# Patient Record
Sex: Male | Born: 1955 | Race: White | Hispanic: No | Marital: Married | Smoking: Never smoker
Health system: Southern US, Community
[De-identification: ages and names within clinical notes are randomized; demographics above are authoritative.]

## PROBLEM LIST (undated history)

## (undated) DIAGNOSIS — I10 Essential (primary) hypertension: Secondary | ICD-10-CM

---

## 2016-02-27 ENCOUNTER — Emergency Department (HOSPITAL_BASED_OUTPATIENT_CLINIC_OR_DEPARTMENT_OTHER): Payer: 59

## 2016-02-27 ENCOUNTER — Inpatient Hospital Stay (HOSPITAL_BASED_OUTPATIENT_CLINIC_OR_DEPARTMENT_OTHER)
Admission: EM | Admit: 2016-02-27 | Discharge: 2016-02-29 | DRG: 064 | Disposition: A | Discharge status: Home / Self Care | Payer: 59 | Attending: Internal Medicine | Admitting: Internal Medicine

## 2016-02-27 ENCOUNTER — Inpatient Hospital Stay (HOSPITAL_COMMUNITY): Payer: 59

## 2016-02-27 ENCOUNTER — Encounter (HOSPITAL_BASED_OUTPATIENT_CLINIC_OR_DEPARTMENT_OTHER): Payer: Self-pay | Admitting: *Deleted

## 2016-02-27 DIAGNOSIS — E860 Dehydration: Secondary | ICD-10-CM | POA: Diagnosis present

## 2016-02-27 DIAGNOSIS — I10 Essential (primary) hypertension: Secondary | ICD-10-CM | POA: Diagnosis present

## 2016-02-27 DIAGNOSIS — G936 Cerebral edema: Secondary | ICD-10-CM | POA: Diagnosis present

## 2016-02-27 DIAGNOSIS — I471 Supraventricular tachycardia, unspecified: Secondary | ICD-10-CM | POA: Diagnosis present

## 2016-02-27 DIAGNOSIS — R112 Nausea with vomiting, unspecified: Secondary | ICD-10-CM | POA: Diagnosis present

## 2016-02-27 DIAGNOSIS — I618 Other nontraumatic intracerebral hemorrhage: Secondary | ICD-10-CM | POA: Diagnosis present

## 2016-02-27 DIAGNOSIS — Z823 Family history of stroke: Secondary | ICD-10-CM

## 2016-02-27 DIAGNOSIS — Z79899 Other long term (current) drug therapy: Secondary | ICD-10-CM

## 2016-02-27 DIAGNOSIS — Q211 Atrial septal defect: Secondary | ICD-10-CM

## 2016-02-27 DIAGNOSIS — I63441 Cerebral infarction due to embolism of right cerebellar artery: Principal | ICD-10-CM | POA: Diagnosis present

## 2016-02-27 DIAGNOSIS — E785 Hyperlipidemia, unspecified: Secondary | ICD-10-CM | POA: Diagnosis present

## 2016-02-27 DIAGNOSIS — R739 Hyperglycemia, unspecified: Secondary | ICD-10-CM | POA: Diagnosis present

## 2016-02-27 DIAGNOSIS — I6789 Other cerebrovascular disease: Secondary | ICD-10-CM | POA: Diagnosis not present

## 2016-02-27 DIAGNOSIS — R42 Dizziness and giddiness: Secondary | ICD-10-CM | POA: Diagnosis not present

## 2016-02-27 DIAGNOSIS — I639 Cerebral infarction, unspecified: Secondary | ICD-10-CM | POA: Diagnosis not present

## 2016-02-27 DIAGNOSIS — R279 Unspecified lack of coordination: Secondary | ICD-10-CM

## 2016-02-27 HISTORY — DX: Essential (primary) hypertension: I10

## 2016-02-27 LAB — CBC WITH DIFFERENTIAL/PLATELET
Basophils Absolute: 0 10*3/uL (ref 0.0–0.1)
Basophils Relative: 0 %
Eosinophils Absolute: 0 10*3/uL (ref 0.0–0.7)
Eosinophils Relative: 0 %
HCT: 40.2 % (ref 39.0–52.0)
HEMOGLOBIN: 13.9 g/dL (ref 13.0–17.0)
LYMPHS PCT: 8 %
Lymphs Abs: 0.6 10*3/uL — ABNORMAL LOW (ref 0.7–4.0)
MCH: 33.5 pg (ref 26.0–34.0)
MCHC: 34.6 g/dL (ref 30.0–36.0)
MCV: 96.9 fL (ref 78.0–100.0)
MONO ABS: 0.4 10*3/uL (ref 0.1–1.0)
Monocytes Relative: 6 %
NEUTROS ABS: 6.2 10*3/uL (ref 1.7–7.7)
Neutrophils Relative %: 86 %
Platelets: 183 10*3/uL (ref 150–400)
RBC: 4.15 MIL/uL — ABNORMAL LOW (ref 4.22–5.81)
RDW: 12.8 % (ref 11.5–15.5)
WBC: 7.2 10*3/uL (ref 4.0–10.5)

## 2016-02-27 LAB — BASIC METABOLIC PANEL
Anion gap: 9 (ref 5–15)
BUN: 22 mg/dL — ABNORMAL HIGH (ref 6–20)
CHLORIDE: 106 mmol/L (ref 101–111)
CO2: 25 mmol/L (ref 22–32)
Calcium: 9.3 mg/dL (ref 8.9–10.3)
Creatinine, Ser: 0.71 mg/dL (ref 0.61–1.24)
GFR calc non Af Amer: >60 mL/min (ref >60)
Glucose, Bld: 146 mg/dL — ABNORMAL HIGH (ref 65–99)
Potassium: 3.6 mmol/L (ref 3.5–5.1)
SODIUM: 140 mmol/L (ref 135–145)

## 2016-02-27 MED ORDER — DIPHENHYDRAMINE HCL 50 MG/ML IJ SOLN
12.5000 mg | Freq: Once | INTRAMUSCULAR | Status: AC
  Administered 2016-02-27: 12.5 mg via INTRAVENOUS
  Filled 2016-02-27: qty 1

## 2016-02-27 MED ORDER — ASPIRIN 300 MG RE SUPP: 300.0000 mg | Freq: Every day | RECTAL | Status: DC

## 2016-02-27 MED ORDER — DEXAMETHASONE SODIUM PHOSPHATE 10 MG/ML IJ SOLN
10.0000 mg | Freq: Once | INTRAMUSCULAR | Status: AC
  Administered 2016-02-27: 10 mg via INTRAVENOUS
  Filled 2016-02-27: qty 1

## 2016-02-27 MED ORDER — PROMETHAZINE HCL 25 MG/ML IJ SOLN
6.2500 mg | Freq: Once | INTRAMUSCULAR | Status: AC
  Administered 2016-02-27: 6.25 mg via INTRAVENOUS

## 2016-02-27 MED ORDER — ONDANSETRON HCL 4 MG/2ML IJ SOLN
4.0000 mg | Freq: Once | INTRAMUSCULAR | Status: AC
  Administered 2016-02-27: 4 mg via INTRAVENOUS

## 2016-02-27 MED ORDER — ASPIRIN 81 MG PO CHEW
324.0000 mg | CHEWABLE_TABLET | Freq: Once | ORAL | Status: AC
Start: 2016-02-27 — End: 2016-02-27
  Administered 2016-02-27: 324 mg via ORAL
  Filled 2016-02-27: qty 4

## 2016-02-27 MED ORDER — ONDANSETRON HCL 4 MG/2ML IJ SOLN
4.0000 mg | Freq: Four times a day (QID) | INTRAMUSCULAR | Status: DC
  Administered 2016-02-27 – 2016-02-28 (×5): 4 mg via INTRAVENOUS
  Filled 2016-02-27 (×5): qty 2

## 2016-02-27 MED ORDER — PROMETHAZINE HCL 25 MG/ML IJ SOLN
INTRAMUSCULAR | Status: AC
  Administered 2016-02-27: 6.25 mg via INTRAVENOUS
  Filled 2016-02-27: qty 1

## 2016-02-27 MED ORDER — STROKE: EARLY STAGES OF RECOVERY BOOK
Freq: Once | Status: AC
  Administered 2016-02-27: 16:00:00

## 2016-02-27 MED ORDER — MECLIZINE HCL 25 MG PO TABS
25.0000 mg | ORAL_TABLET | Freq: Once | ORAL | Status: AC
Start: 2016-02-27 — End: 2016-02-27
  Administered 2016-02-27: 25 mg via ORAL
  Filled 2016-02-27: qty 1

## 2016-02-27 MED ORDER — ASPIRIN 325 MG PO TABS
325.0000 mg | ORAL_TABLET | Freq: Every day | ORAL | Status: DC
  Administered 2016-02-28 – 2016-02-29 (×2): 325 mg via ORAL
  Filled 2016-02-27 (×2): qty 1

## 2016-02-27 MED ORDER — BLISTEX MEDICATED EX OINT
TOPICAL_OINTMENT | CUTANEOUS | Status: DC | PRN
  Administered 2016-02-27: 1 via TOPICAL

## 2016-02-27 MED ORDER — PROMETHAZINE HCL 25 MG/ML IJ SOLN
25.0000 mg | Freq: Four times a day (QID) | INTRAMUSCULAR | Status: DC | PRN
  Administered 2016-02-27: 25 mg via INTRAVENOUS
  Filled 2016-02-27: qty 1

## 2016-02-27 MED ORDER — KETOROLAC TROMETHAMINE 15 MG/ML IJ SOLN
15.0000 mg | Freq: Four times a day (QID) | INTRAMUSCULAR | Status: DC | PRN
  Administered 2016-02-27 – 2016-02-28 (×2): 15 mg via INTRAVENOUS
  Filled 2016-02-27 (×2): qty 1

## 2016-02-27 MED ORDER — ONDANSETRON HCL 4 MG/2ML IJ SOLN
INTRAMUSCULAR | Status: AC
  Administered 2016-02-27: 4 mg via INTRAVENOUS
  Filled 2016-02-27: qty 2

## 2016-02-27 MED ORDER — SODIUM CHLORIDE 0.9 % IV SOLN
INTRAVENOUS | Status: DC
  Administered 2016-02-27 – 2016-02-28 (×2): via INTRAVENOUS

## 2016-02-27 MED ORDER — DIAZEPAM 5 MG/ML IJ SOLN
5.0000 mg | Freq: Once | INTRAMUSCULAR | Status: AC
  Administered 2016-02-27: 5 mg via INTRAVENOUS
  Filled 2016-02-27: qty 2

## 2016-02-27 MED ORDER — ENOXAPARIN SODIUM 40 MG/0.4ML ~~LOC~~ SOLN
40.0000 mg | SUBCUTANEOUS | Status: DC
  Administered 2016-02-27 – 2016-02-28 (×2): 40 mg via SUBCUTANEOUS
  Filled 2016-02-27 (×3): qty 0.4

## 2016-02-27 MED ORDER — METOCLOPRAMIDE HCL 5 MG/ML IJ SOLN
5.0000 mg | Freq: Four times a day (QID) | INTRAMUSCULAR | Status: DC
  Administered 2016-02-27 – 2016-02-29 (×6): 5 mg via INTRAVENOUS
  Filled 2016-02-27 (×6): qty 2

## 2016-02-27 MED ORDER — ASPIRIN 325 MG PO TABS: 325.0000 mg | ORAL_TABLET | Freq: Every day | ORAL | Status: DC

## 2016-02-27 MED ORDER — BLISTEX MEDICATED EX OINT
TOPICAL_OINTMENT | CUTANEOUS | Status: AC
  Administered 2016-02-27: 1 via TOPICAL
  Filled 2016-02-27: qty 10

## 2016-02-27 NOTE — H&P (Signed)
History and Physical    Jacob NephewRex Lynch ZOX:[id number] DOB: 18-Oct-1955 DOA: 02/27/2016   PCP: No PCP Per Patient /located in [place]  Patient coming from/Resides with: Private residence/lives with spouse  Admission status: Inpatient/telemetry -medically necessary to stay a minimum 2 midnights to rule out impending and/or unexpected changes in physiologic status that may differ from initial evaluation performed in the ER and/or at time of admission. 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 Patient will require IV fluids and IV medications to manage his dizziness and nausea symptoms until he is able to tolerate oral medications/diet. He will also undergo an extensive stroke evaluation including PT/OT evaluation with likelihood he may discharge to a rehabilitation facility for further stroke care  Chief Complaint: Dizziness with nausea and vomiting  HPI: Jacob Lynch is a 60 y.o. male with medical history significant for hypertension and SVT. Patient works as a Research scientist (medical)consultant and had traveled from [place] to [place] to conduct business. He did fly via [employer] but was not on the plane longer than 2 hours. He has not taken any international flights recently. He reports that around 10:00 last night he became extremely dizzy with significant nausea and repeated episodes of vomiting. He did not recall any vertigo symptoms. He did not recall any specific visual field changes, blurred vision or diplopia. He was driven by a worker from [place] to [place] and brought to the ER here for further evaluation and treatment. Patient does report that his father has a history of CVA.  ED Course:  Vital Signs: BP (!) 148/67 (BP Location: Right Arm)   Pulse 69   Temp 99 F (37.2 C) (Oral)   Resp 17   Ht 6\' 7"  (2.007 m)   Wt  99.8 kg (220 lb)   SpO2 100%   BMI 24.78 kg/m  CT head without contrast: Subacute infarct in the right inferior cerebellar hemisphere in the PICA Lab data: Sodium 140, potassium 3.6, CO2 25, BUN 22, creatinine 0.71, glucose 146, white count 7200 with left shift but normal segmented neutrophils, hemoglobin 13.9, platelets 183,000 Medications and treatments: Valium 5 mg IV 1, Benadryl 12.5 mg IV 1, Antivert 25 mg PO 1, Zofran 4 mg IV 2 doses, Phenergan 6.25 mg IV 1, aspirin 324 mg ordered but not given secondary to severe nausea  Review of Systems:  In addition to the HPI above,  No Fever-chills, myalgias or other constitutional symptoms No Headache, changes with Vision or hearing, new weakness, tingling, numbness in any extremity, dysarthria or word finding difficulty, gait disturbance or imbalance, tremors or seizure activity No problems swallowing food or Liquids, indigestion/reflux, choking or coughing while eating, abdominal pain with or after eating No Chest pain, Cough or Shortness of Breath, palpitations, orthopnea or DOE No Abdominal pain, melena,hematochezia, dark tarry stools No dysuria, malodorous urine, hematuria or flank pain No new skin rashes, lesions, masses or bruises, No new joint pains, aches, swelling or redness No recent unintentional weight gain or loss No polyuria, polydypsia or polyphagia   Past Medical History:  Diagnosis Date  . Hypertension     History reviewed. No pertinent surgical history.  Social History   Social History  . Marital status: Married    Spouse name: N/A  . Number of children: N/A  . Years of education: N/A   Occupational History  . Not on file.  Social History Main Topics  . Smoking status: Never Smoker  . Smokeless tobacco: Never Used  . Alcohol use Yes     Comment: social  . Drug use: No  . Sexual activity: Not on file   Other Topics Concern  . Not on file   Social History Narrative  . No narrative on file     Mobility: Without assistive devices-patient typically performs 30 minutes of cardio 7 days a week Work history: Works as a Research scientist (medical) and travels frequently   No Known Allergies  Family history reviewed; father with history of CVA  Prior to Admission medications   Medication Sig Start Date End Date Taking? Authorizing Provider  lisinopril (PRINIVIL,ZESTRIL) 2.5 MG tablet Take 2.5 mg by mouth daily.   Yes Historical Provider, MD    Physical Exam: Vitals:   02/27/16 1342 02/27/16 1345 02/27/16 1354 02/27/16 1510  BP: 146/76  146/75 (!) 148/67  Pulse: 70 70 78 69  Resp: 18  20 17   Temp: 98.5 F (36.9 C)   99 F (37.2 C)  TempSrc: Oral   Oral  SpO2: 100% 97% 100% 100%  Weight:      Height:          Constitutional: NAD, calm, uncomfortable setting of ongoing dizziness and nausea Eyes: PERRL, lids and conjunctivae normal-sized closed secondary to ongoing dizziness and nausea ENMT: Mucous membranes are dry. Posterior pharynx clear of any exudate or lesions.Normal dentition.  Neck: normal, supple, no masses, no thyromegaly Respiratory: clear to auscultation bilaterally, no wheezing, no crackles. Normal respiratory effort. No accessory muscle use.  Cardiovascular: Regular rate and rhythm, no murmurs / rubs / gallops. No extremity edema. 2+ pedal pulses. No carotid bruits.  Abdomen: no tenderness, no masses palpated. No hepatosplenomegaly. Bowel sounds positive.  Musculoskeletal: no clubbing / cyanosis. No joint deformity upper and lower extremities. Good ROM, no contractures. Normal muscle tone.  Skin: no rashes, lesions, ulcers. No induration Neurologic: CN 2-12 grossly intact. Sensation intact, DTR normal. Strength 5/5 x all 4 extremities.  Psychiatric: Normal judgment and insight. Alert and oriented x 3. Normal mood.    Labs on Admission: I have personally reviewed following labs and imaging studies  CBC:  Recent Labs Lab 02/27/16 1050  WBC 7.2  NEUTROABS 6.2  HGB  13.9  HCT 40.2  MCV 96.9  PLT 183   Basic Metabolic Panel:  Recent Labs Lab 02/27/16 1050  NA 140  K 3.6  CL 106  CO2 25  GLUCOSE 146*  BUN 22*  CREATININE 0.71  CALCIUM 9.3   GFR: Estimated Creatinine Clearance: 131.8 mL/min (by C-G formula based on SCr of 0.71 mg/dL). Liver Function Tests: No results for input(s): AST, ALT, ALKPHOS, BILITOT, PROT, ALBUMIN in the last 168 hours. No results for input(s): LIPASE, AMYLASE in the last 168 hours. No results for input(s): AMMONIA in the last 168 hours. Coagulation Profile: No results for input(s): INR, PROTIME in the last 168 hours. Cardiac Enzymes: No results for input(s): CKTOTAL, CKMB, CKMBINDEX, TROPONINI in the last 168 hours. BNP (last 3 results) No results for input(s): PROBNP in the last 8760 hours. HbA1C: No results for input(s): HGBA1C in the last 72 hours. CBG: No results for input(s): GLUCAP in the last 168 hours. Lipid Profile: No results for input(s): CHOL, HDL, LDLCALC, TRIG, CHOLHDL, LDLDIRECT in the last 72 hours. Thyroid Function Tests: No results for input(s): TSH, T4TOTAL, FREET4, T3FREE, THYROIDAB in the last 72 hours. Anemia Panel: No results for input(s): VITAMINB12, FOLATE,  FERRITIN, TIBC, IRON, RETICCTPCT in the last 72 hours. Urine analysis: No results found for: COLORURINE, APPEARANCEUR, LABSPEC, PHURINE, GLUCOSEU, HGBUR, BILIRUBINUR, KETONESUR, PROTEINUR, UROBILINOGEN, NITRITE, LEUKOCYTESUR Sepsis Labs: @LABRCNTIP (procalcitonin:4,lacticidven:4) )No results found for this or any previous visit (from the past 240 hour(s)).   Radiological Exams on Admission: Ct Head Wo Contrast  Result Date: 02/27/2016 CLINICAL DATA:  60 year old male with vertigo, nausea and vomiting. EXAM: CT HEAD WITHOUT CONTRAST TECHNIQUE: Contiguous axial images were obtained from the base of the skull through the vertex without intravenous contrast. COMPARISON:  None. FINDINGS: Brain: There is a region of relative  hypoattenuation in the right cerebellar hemisphere extending to the posteromedial cortex and the vermis. No evidence of hemorrhagic transformation. There is mild blurring of the adjacent gray-white interface. No definite extension to the medulla by CT imaging. Very mild cerebral cortical volume loss. No significant chronic microvascular ischemic white matter changes. No mass lesion, mass effect or hydrocephalus. Vascular: No hyperdense vessel or unexpected calcification. Skull: Normal. Negative for fracture or focal lesion. Sinuses/Orbits: No acute finding. Other: None. IMPRESSION: Positive for findings of a subacute infarct in the right inferior cerebellar hemisphere in the region of the posterior inferior cerebellar artery (PICA). Cerebral cortical volume loss. These results were called by telephone at the time of interpretation on 02/27/2016 at 11:50 am to Dr. Rolland Porter , who verbally acknowledged these results. Electronically Signed   By: Malachy Moan M.D.   On: 02/27/2016 11:50    EKG: (Independently reviewed) sinus rhythm with ventricular rate 70 bpm, QTC 484 ms, no ischemic changes  Assessment/Plan Principal Problem:   Cerebellar stroke  -Patient presents with persistent dizziness with nausea and vomiting with CT revealing subacute cerebellar stroke; no other focal neurological symptoms and has presented outside of window for TPA -Neurology consulted -TIA/stroke order set initiated -Echocardiogram -MRI/MRA -Carotid duplex -Was not given aspirin initially due to nausea and vomiting but have ordered aspirin by either PO or PR route -PT/OT/SLP evaluation -Hgba1c and lipid panel -Neuro checks per protocol -Dizziness not consistent with vertigo; also complaining of headache so have ordered Toradol IV prn and a one-time dose of Decadron  Active Problems:   Nausea with vomiting -Secondary to cerebellar stroke -Appears to have associated dehydration so will give IV fluids until able to  tolerate POs -Clear liquids initially -Scheduled Zofran IV -Scheduled Reglan IV -prn Phenergan    Acute hyperglycemia -Follow-up on hemoglobin A1c    HTN (hypertension) -Allow permissive hypertension -Hold preadmission lisinopril    Paroxysmal SVT (supraventricular tachycardia)  -Remote history of and no recent history of palpitations -Not on rate controlling agents prior to admission      DVT prophylaxis: Lovenox Code Status: Full  Family Communication: No family bedside at time of evaluation Disposition Plan: Anticipate discharge back to preadmission home environment pending PT/OT evaluation-may require short-term rehabilitation based on recurrent issues with dizziness and suspected underlying gait disturbance Consults called: Neurology/Dr. Annabelle Harman ANP-BC [facility] Pager [phone]   If 7PM-7AM, please contact night-coverage [email or web address] Password [email or web address]  02/27/2016, 5:08 PM

## 2016-02-27 NOTE — ED Triage Notes (Signed)
Patient states he developed a sudden severe dizziness around 2200 last night.  Approximately ten minutes later, he had severe nausea and vomiting.  Describes the dizziness as the room spinning and his head was spinning as well.  States the dizziness has improved slightly since last night, continues to have constant nausea and intermittent vomiting.  States he flew on an airplane four days ago to [place], [place], and has been working and driving since.  Ate chinese takeout food prior to the episode starting.

## 2016-02-27 NOTE — Progress Notes (Signed)
   Patient coming from [facility] for confirmed right inferior cerebellar infarct, described as subacute in the PICA distribution. Neurology aware of patient. Patient to be [facility] telemetry bed on an inpatient status. Will need full stroke workup and likely transfer to inpatient rehabilitation. Not a TPA candidate as symptoms started approximately 18 hours ago.   Shelly Flattenavid Teela Narducci, MD [facility] 02/27/2016, 1:15 PM

## 2016-02-27 NOTE — Consult Note (Signed)
Admission H&P    Chief Complaint: Acute onset of vertigo nausea and unstable gait.  HPI: Jacob Lynch is an 60 y.o. male with a history of hypertension who experienced acute onset of vertigo nausea and vomiting and unstable gait at 10 PM on 02/26/2016. There was no focal weakness. He had no speech change and no diplopia. There is no previous history of stroke nor TIA. He has not been on antiplatelet therapy. CT scan and MRI showed findings consistent with acute right nonhemorrhagic PICA territory infarction. MR angiogram was unremarkable with no large vessel occlusion or significant stenosis. NIH stroke score the time of this evaluation was 0. He has continued to experience vertigo and nausea, although improved from last night.  LSN: 10:00 PM on 02/26/2016. tPA Given: No: Beyond time window for treatment consideration mRankin:  Past Medical History:  Diagnosis Date  . Hypertension     History reviewed. No pertinent surgical history.  No family history on file. Social History:  reports that he has never smoked. He has never used smokeless tobacco. He reports that he drinks alcohol. He reports that he does not use drugs.  Allergies: No Known Allergies  Medications Prior to Admission  Medication Sig Dispense Refill  . lisinopril (PRINIVIL,ZESTRIL) 2.5 MG tablet Take 2.5 mg by mouth daily.      ROS: History obtained from the patient  General ROS: negative for - chills, fatigue, fever, night sweats, weight gain or weight loss Psychological ROS: negative for - behavioral disorder, hallucinations, memory difficulties, mood swings or suicidal ideation Ophthalmic ROS: negative for - blurry vision, double vision, eye pain or loss of vision ENT ROS: negative for - epistaxis, nasal discharge, oral lesions, sore throat, tinnitus or vertigo Allergy and Immunology ROS: negative for - hives or itchy/watery eyes Hematological and Lymphatic ROS: negative for - bleeding problems, bruising or swollen  lymph nodes Endocrine ROS: negative for - galactorrhea, hair pattern changes, polydipsia/polyuria or temperature intolerance Respiratory ROS: negative for - cough, hemoptysis, shortness of breath or wheezing Cardiovascular ROS: negative for - chest pain, dyspnea on exertion, edema or irregular heartbeat Gastrointestinal ROS: negative for - abdominal pain, diarrhea, hematemesis, nausea/vomiting or stool incontinence Genito-Urinary ROS: negative for - dysuria, hematuria, incontinence or urinary frequency/urgency Musculoskeletal ROS: negative for - joint swelling or muscular weakness Neurological ROS: as noted in HPI Dermatological ROS: negative for rash and skin lesion changes  Physical Examination: Blood pressure (!) 148/67, pulse 69, temperature 99 F (37.2 C), temperature source Oral, resp. rate 17, height _0  (2.007 m), weight 99.8 kg (220 lb), SpO2 100 %.  HEENT-  Normocephalic, no lesions, without obvious abnormality.  Normal external eye and conjunctiva.  Normal TM's bilaterally.  Normal auditory canals and external ears. Normal external nose, mucus membranes and septum.  Normal pharynx. Neck supple with no masses, nodes, nodules or enlargement. Cardiovascular - regular rate and rhythm, S1, S2 normal, no murmur, click, rub or gallop Lungs - chest clear, no wheezing, rales, normal symmetric air entry Abdomen - soft, non-tender; bowel sounds normal; no masses,  no organomegaly Extremities - no joint deformities, effusion, or inflammation and no edema  Neurologic Examination: Mental Status: Alert, oriented, complaining of mild nausea and.  Speech fluent without evidence of aphasia. Able to follow commands without difficulty. Cranial Nerves: II-Visual fields were normal. III/IV/VI-Pupils were equal and reacted normally to light. Extraocular movements were full and conjugate.    V/VII-no facial numbness and no facial weakness. VIII-normal. X-normal speech and symmetrical palatal  movement. XI:  trapezius strength/neck flexion strength normal bilaterally XII-midline tongue extension with normal strength. Motor: 5/5 bilaterally with normal tone and bulk Sensory: Normal throughout. Deep Tendon Reflexes: 2+ and symmetric. Plantars: Mute bilaterally Cerebellar: Normal finger-to-nose and heel-to-shin testing. Carotid auscultation: Normal  Results for orders placed or performed during the hospital encounter of 02/27/16 (from the past 48 hour(s))  CBC with Differential/Platelet     Status: Abnormal   Collection Time: 02/27/16 10:50 AM  Result Value Ref Range   WBC 7.2 4.0 - 10.5 K/uL   RBC 4.15 (L) 4.22 - 5.81 MIL/uL   Hemoglobin 13.9 13.0 - 17.0 g/dL   HCT 40.2 39.0 - 52.0 %   MCV 96.9 78.0 - 100.0 fL   MCH 33.5 26.0 - 34.0 pg   MCHC 34.6 30.0 - 36.0 g/dL   RDW 12.8 11.5 - 15.5 %   Platelets 183 150 - 400 K/uL   Neutrophils Relative % 86 %   Neutro Abs 6.2 1.7 - 7.7 K/uL   Lymphocytes Relative 8 %   Lymphs Abs 0.6 (L) 0.7 - 4.0 K/uL   Monocytes Relative 6 %   Monocytes Absolute 0.4 0.1 - 1.0 K/uL   Eosinophils Relative 0 %   Eosinophils Absolute 0.0 0.0 - 0.7 K/uL   Basophils Relative 0 %   Basophils Absolute 0.0 0.0 - 0.1 K/uL  Basic metabolic panel     Status: Abnormal   Collection Time: 02/27/16 10:50 AM  Result Value Ref Range   Sodium 140 135 - 145 mmol/L   Potassium 3.6 3.5 - 5.1 mmol/L   Chloride 106 101 - 111 mmol/L   CO2 25 22 - 32 mmol/L   Glucose, Bld 146 (H) 65 - 99 mg/dL   BUN 22 (H) 6 - 20 mg/dL   Creatinine, Ser 0.71 0.61 - 1.24 mg/dL   Calcium 9.3 8.9 - 10.3 mg/dL   GFR calc non Af Amer >60 >60 mL/min   GFR calc Af Amer >60 >60 mL/min    Comment: (NOTE) The eGFR has been calculated using the CKD EPI equation. This calculation has not been validated in all clinical situations. eGFR's persistently <60 mL/min signify possible Chronic Kidney Disease.    Anion gap 9 5 - 15   Ct Head Wo Contrast  Result Date: 02/27/2016 CLINICAL DATA:   60 year old male with vertigo, nausea and vomiting. EXAM: CT HEAD WITHOUT CONTRAST TECHNIQUE: Contiguous axial images were obtained from the base of the skull through the vertex without intravenous contrast. COMPARISON:  None. FINDINGS: Brain: There is a region of relative hypoattenuation in the right cerebellar hemisphere extending to the posteromedial cortex and the vermis. No evidence of hemorrhagic transformation. There is mild blurring of the adjacent gray-white interface. No definite extension to the medulla by CT imaging. Very mild cerebral cortical volume loss. No significant chronic microvascular ischemic white matter changes. No mass lesion, mass effect or hydrocephalus. Vascular: No hyperdense vessel or unexpected calcification. Skull: Normal. Negative for fracture or focal lesion. Sinuses/Orbits: No acute finding. Other: None. IMPRESSION: Positive for findings of a subacute infarct in the right inferior cerebellar hemisphere in the region of the posterior inferior cerebellar artery (PICA). Cerebral cortical volume loss. These results were called by telephone at the time of interpretation on 02/27/2016 at 11:50 am to Dr. Tanna Furry , who verbally acknowledged these results. Electronically Signed   By: Jacqulynn Cadet M.D.   On: 02/27/2016 11:50   Mr Virgel Paling HW Contrast  Result Date: 02/27/2016 CLINICAL DATA:  60 year old  male with acute onset dizziness nausea and vomiting 2200 hours yesterday. Evidence of cerebellar infarct on noncontrast head CT today. Initial encounter. EXAM: MRI HEAD WITHOUT CONTRAST MRA HEAD WITHOUT CONTRAST TECHNIQUE: Multiplanar, multiecho pulse sequences of the brain and surrounding structures were obtained without intravenous contrast. Angiographic images of the head were obtained using MRA technique without contrast. COMPARISON:  Head CT 1140 hours. FINDINGS: MRI HEAD FINDINGS Brain: Moderate size (5.5 x 4 cm) area of confluent restricted diffusion in the inferior right  cerebellum, extending from the lateral and posterior cerebellar tonsil to the right cerebellar vermis. Cerebellar edema with mild petechial hemorrhage (series 9, image 4) and mild mass effect on the fourth ventricle. Basilar cisterns remain patent. No ventriculomegaly or transependymal edema. No other restricted diffusion. However, there is a small chronic posterior left cerebellar infarct (series 7, image 5). Negative brainstem. Elsewhere there is minimal nonspecific cerebral white matter T2 and FLAIR hyperintensity. No supratentorial cortical encephalomalacia or chronic cerebral blood products. No midline shift, evidence of mass lesion, extra-axial collection or acute intracranial hemorrhage. Cervicomedullary junction and pituitary are within normal limits. Vascular: Major intracranial vascular flow voids are preserved, the distal left vertebral artery appears dominant and is somewhat dolichoectatic. There is also a degree of anterior circulation dolichoectasia. Skull and upper cervical spine: Negative. Normal bone marrow signal. Sinuses/Orbits: Normal orbit soft tissues. Visualized paranasal sinuses and mastoids are stable and well pneumatized. Other: Visible internal auditory structures appear normal. Negative scalp soft tissues. MRA HEAD FINDINGS Symmetric appearing antegrade flow in both distal cervical vertebral artery is, the left is dominant. There does appear to be preserved flow signal at the right PICA origin. Decreased signal in the right V4 segment distal to the PICA might be artifact due to its horizontal course. The vertebrobasilar junction is patent. The basilar artery is patent without stenosis. Normal SCA and left PCA origins. Fetal type right PCA origin. The left posterior communicating artery is present. Bilateral PCA branches are normal. Antegrade flow in both ICA siphons. Tortuous siphons with no stenosis. Normal ophthalmic and posterior communicating artery origins. Patent carotid termini,  MCA and ACA origins. Anterior communicating artery and visualized ACA branches are within normal limits (there is a median artery of the corpus callosum, normal variant). Tortuous but otherwise normal MCA M1 segments. Patent MCA bifurcations. Visualized bilateral MCA branches are within normal limits. IMPRESSION: 1. Confluent moderate-sized right PICA territory infarct with cerebellar edema and petechial hemorrhage, but no malignant hemorrhagic transformation. 2. There is mild mass effect on the fourth ventricle, but no ventriculomegaly and other basilar cisterns are patent. 3. Patent non dominant distal right vertebral artery. The right PICA origin also appears to be patent. 4. No other acute infarct, but there is a small chronic left cerebellar infarct. 5. Generalized intracranial artery dolichoectasia. Electronically Signed   By: Genevie Ann M.D.   On: 02/27/2016 19:36   Mr Brain Wo Contrast  Result Date: 02/27/2016 CLINICAL DATA:  60 year old male with acute onset dizziness nausea and vomiting 2200 hours yesterday. Evidence of cerebellar infarct on noncontrast head CT today. Initial encounter. EXAM: MRI HEAD WITHOUT CONTRAST MRA HEAD WITHOUT CONTRAST TECHNIQUE: Multiplanar, multiecho pulse sequences of the brain and surrounding structures were obtained without intravenous contrast. Angiographic images of the head were obtained using MRA technique without contrast. COMPARISON:  Head CT 1140 hours. FINDINGS: MRI HEAD FINDINGS Brain: Moderate size (5.5 x 4 cm) area of confluent restricted diffusion in the inferior right cerebellum, extending from the lateral and posterior cerebellar tonsil  to the right cerebellar vermis. Cerebellar edema with mild petechial hemorrhage (series 9, image 4) and mild mass effect on the fourth ventricle. Basilar cisterns remain patent. No ventriculomegaly or transependymal edema. No other restricted diffusion. However, there is a small chronic posterior left cerebellar infarct (series  7, image 5). Negative brainstem. Elsewhere there is minimal nonspecific cerebral white matter T2 and FLAIR hyperintensity. No supratentorial cortical encephalomalacia or chronic cerebral blood products. No midline shift, evidence of mass lesion, extra-axial collection or acute intracranial hemorrhage. Cervicomedullary junction and pituitary are within normal limits. Vascular: Major intracranial vascular flow voids are preserved, the distal left vertebral artery appears dominant and is somewhat dolichoectatic. There is also a degree of anterior circulation dolichoectasia. Skull and upper cervical spine: Negative. Normal bone marrow signal. Sinuses/Orbits: Normal orbit soft tissues. Visualized paranasal sinuses and mastoids are stable and well pneumatized. Other: Visible internal auditory structures appear normal. Negative scalp soft tissues. MRA HEAD FINDINGS Symmetric appearing antegrade flow in both distal cervical vertebral artery is, the left is dominant. There does appear to be preserved flow signal at the right PICA origin. Decreased signal in the right V4 segment distal to the PICA might be artifact due to its horizontal course. The vertebrobasilar junction is patent. The basilar artery is patent without stenosis. Normal SCA and left PCA origins. Fetal type right PCA origin. The left posterior communicating artery is present. Bilateral PCA branches are normal. Antegrade flow in both ICA siphons. Tortuous siphons with no stenosis. Normal ophthalmic and posterior communicating artery origins. Patent carotid termini, MCA and ACA origins. Anterior communicating artery and visualized ACA branches are within normal limits (there is a median artery of the corpus callosum, normal variant). Tortuous but otherwise normal MCA M1 segments. Patent MCA bifurcations. Visualized bilateral MCA branches are within normal limits. IMPRESSION: 1. Confluent moderate-sized right PICA territory infarct with cerebellar edema and  petechial hemorrhage, but no malignant hemorrhagic transformation. 2. There is mild mass effect on the fourth ventricle, but no ventriculomegaly and other basilar cisterns are patent. 3. Patent non dominant distal right vertebral artery. The right PICA origin also appears to be patent. 4. No other acute infarct, but there is a small chronic left cerebellar infarct. 5. Generalized intracranial artery dolichoectasia. Electronically Signed   By: Genevie Ann M.D.   On: 02/27/2016 19:36    Assessment: 60 y.o. male with multiple risk factors for stroke presenting with acute right PICA distribution ischemic infarction.  Stroke Risk Factors - family history and hypertension  Plan: 1. HgbA1c, fasting lipid panel 2. Prophylactic therapy-Antiplatelet med: Aspirin  3. PT consult 4. Echocardiogram 5. Carotid dopplers 6. Risk factor modification 6. Telemetry monitoring  C.R. Nicole Kindred, MD [facility] [phone]  02/27/2016, 8:17 PM

## 2016-02-27 NOTE — Progress Notes (Signed)
Pt arrived on unit from [facility]. Pt alert and oriented to unit. Pt given call button. Vitals taken.

## 2016-02-27 NOTE — ED Notes (Signed)
[facility] is aware of [phone] [facility]

## 2016-02-27 NOTE — ED Provider Notes (Signed)
[facility] Provider Note   CSN: [id number] Arrival date & time: 02/27/16  1015     History   Chief Complaint Chief Complaint  Patient presents with  . Dizziness    HPI Jacob Lynch is a 60 y.o. male.  Last time normal at 8 PM 02/26/2016   He presents with chief complaint of severe dizziness. He lives in [place]. He was traveling. He flew into [place] yesterday. He is in a hotel last night. He had sudden onset of severe vertigo described as spinning and difficulty with balance and walking accompanied by nausea and vomiting most of last night. He presents here, comes in by coworker with ongoing dizziness and nausea.  He complains of a mild headache now. No sudden or severe headache last night. He states that he laid down on his couch after eating some "not very good [place] food". The spinning sensation started and he had good to the bathroom. He states he could barely walk because of his difficulty with coordination and balance and had to hold onto the walls. This continued multiple times over the next several hours.  History of SVT. History of hypertension well-controlled on low-dose lisinopril. Lifetime nonsmoker. No history of diabetes or high cholesterol. Family history negative for heart disease or CVA. No fall or injury. No neck pain.  HPI  Past Medical History:  Diagnosis Date  . Hypertension     Patient Active Problem List   Diagnosis Date Noted  . CVA (cerebral vascular accident) (HCC) 02/27/2016    History reviewed. No pertinent surgical history.     Home Medications    Prior to Admission medications   Medication Sig Start Date End Date Taking? Authorizing Provider  lisinopril (PRINIVIL,ZESTRIL) 2.5 MG tablet Take 2.5 mg by mouth daily.   Yes Historical Provider, MD    Family History No family history on file.  Social History Social History  Substance Use Topics  . Smoking status: Never Smoker  . Smokeless tobacco: Never Used  .  Alcohol use Yes     Comment: social     Allergies   Review of patient's allergies indicates no known allergies.   Review of Systems Review of Systems  Constitutional: Negative for appetite change, chills, diaphoresis, fatigue and fever.  HENT: Negative for mouth sores, sore throat and trouble swallowing.   Eyes: Negative for visual disturbance.  Respiratory: Negative for cough, chest tightness, shortness of breath and wheezing.   Cardiovascular: Negative for chest pain.  Gastrointestinal: Positive for nausea and vomiting. Negative for abdominal distention, abdominal pain and diarrhea.  Endocrine: Negative for polydipsia, polyphagia and polyuria.  Genitourinary: Negative for dysuria, frequency and hematuria.  Musculoskeletal: Negative for gait problem.  Skin: Negative for color change, pallor and rash.  Neurological: Positive for dizziness and headaches. Negative for syncope and light-headedness.  Hematological: Does not bruise/bleed easily.  Psychiatric/Behavioral: Negative for behavioral problems and confusion.     Physical Exam Updated Vital Signs BP 143/84 (BP Location: Right Arm)   Pulse 71   Temp 97.9 F (36.6 C)   Resp 18   Ht 6\' 7"  (2.007 m)   Wt 220 lb (99.8 kg)   SpO2 98%   BMI 24.78 kg/m   Physical Exam  Constitutional: He is oriented to person, place, and time. He appears well-developed and well-nourished. No distress.  Appears uncomfortable without frank distress. He keeps his eyes tightly closed lying semi-recumbent.  HENT:  Head: Normocephalic.  Eyes: Conjunctivae are normal. Pupils are equal, round, and  reactive to light. No scleral icterus.  Neck: Normal range of motion. Neck supple. No thyromegaly present.  Cardiovascular: Normal rate and regular rhythm.  Exam reveals no gallop and no friction rub.   No murmur heard. Sinus rhythm on the monitor  Pulmonary/Chest: Effort normal and breath sounds normal. No respiratory distress. He has no wheezes. He  has no rales.  Abdominal: Soft. Bowel sounds are normal. He exhibits no distension. There is no tenderness. There is no rebound.  Musculoskeletal: Normal range of motion.  Neurological: He is alert and oriented to person, place, and time.  Cranial nerves reports normal vision. Pupils equal and reactive. Normally struck her movements. Symptomatic with vertigo but no nystagmus to lateral gaze. No palsies. Normal sensation V1 through V3. Reports normal hearing to finger rub. Reports normal facial sensation. No ptosis. Can elevate and close the eyes. Normal symmetric smile without lower facial droop. Symmetric palate. Normal tongue protrusion. Normal shoulder shrug.  No pronator drift. Normal sensation in the upper extremities. No leg drift. Normal heel-to-shin. Normal sensation lower shoulders.  Skin: Skin is warm and dry. No rash noted.  Psychiatric: He has a normal mood and affect. His behavior is normal.     ED Treatments / Results  Labs (all labs ordered are listed, but only abnormal results are displayed) Labs Reviewed  CBC WITH DIFFERENTIAL/PLATELET - Abnormal; Notable for the following:       Result Value   RBC 4.15 (*)    Lymphs Abs 0.6 (*)    All other components within normal limits  BASIC METABOLIC PANEL - Abnormal; Notable for the following:    Glucose, Bld 146 (*)    BUN 22 (*)    All other components within normal limits    EKG  EKG Interpretation None       Radiology Ct Head Wo Contrast  Result Date: 02/27/2016 CLINICAL DATA:  60 year old male with vertigo, nausea and vomiting. EXAM: CT HEAD WITHOUT CONTRAST TECHNIQUE: Contiguous axial images were obtained from the base of the skull through the vertex without intravenous contrast. COMPARISON:  None. FINDINGS: Brain: There is a region of relative hypoattenuation in the right cerebellar hemisphere extending to the posteromedial cortex and the vermis. No evidence of hemorrhagic transformation. There is mild blurring of  the adjacent gray-white interface. No definite extension to the medulla by CT imaging. Very mild cerebral cortical volume loss. No significant chronic microvascular ischemic white matter changes. No mass lesion, mass effect or hydrocephalus. Vascular: No hyperdense vessel or unexpected calcification. Skull: Normal. Negative for fracture or focal lesion. Sinuses/Orbits: No acute finding. Other: None. IMPRESSION: Positive for findings of a subacute infarct in the right inferior cerebellar hemisphere in the region of the posterior inferior cerebellar artery (PICA). Cerebral cortical volume loss. These results were called by telephone at the time of interpretation on 02/27/2016 at 11:50 am to Dr. Rolland Porter , who verbally acknowledged these results. Electronically Signed   By: Malachy Moan M.D.   On: 02/27/2016 11:50    Procedures Procedures (including critical care time)  Medications Ordered in ED Medications  aspirin chewable tablet 324 mg (not administered)  diazepam (VALIUM) injection 5 mg (5 mg Intravenous Given 02/27/16 1120)  diphenhydrAMINE (BENADRYL) injection 12.5 mg (12.5 mg Intravenous Given 02/27/16 1121)  meclizine (ANTIVERT) tablet 25 mg (25 mg Oral Given 02/27/16 1125)  ondansetron (ZOFRAN) injection 4 mg (4 mg Intravenous Given 02/27/16 1113)     Initial Impression / Assessment and Plan / ED Course  I  have reviewed the triage vital signs and the nursing notes.  Pertinent labs & imaging results that were available during my care of the patient were reviewed by me and considered in my medical decision making (see chart for details).  Clinical Course    Acute vertigo. Central versus peripheral. CT scan reportedly shows right inferior cerebellar infarct. Subacute. PICA distribution. No additional symptoms to suggest midbrain extension or Wallenberg's stroke. Discussed with neurology. Given aspirin. [facility] to [facility]. Call placed hospitalist for admission.  Patient not given,  or considered a candidate for TPA as he presented a greater than 12 hours after symptoms.  Final Clinical Impressions(s) / ED Diagnoses   Final diagnoses:  Cerebrovascular accident (CVA), unspecified mechanism (HCC)    New Prescriptions New Prescriptions   No medications on file     Rolland PorterMark Azie Mcconahy, MD 02/27/16 1248

## 2016-02-28 ENCOUNTER — Inpatient Hospital Stay (HOSPITAL_COMMUNITY): Payer: 59

## 2016-02-28 DIAGNOSIS — R112 Nausea with vomiting, unspecified: Secondary | ICD-10-CM

## 2016-02-28 DIAGNOSIS — I10 Essential (primary) hypertension: Secondary | ICD-10-CM

## 2016-02-28 DIAGNOSIS — I639 Cerebral infarction, unspecified: Secondary | ICD-10-CM

## 2016-02-28 DIAGNOSIS — I6789 Other cerebrovascular disease: Secondary | ICD-10-CM

## 2016-02-28 LAB — ECHOCARDIOGRAM COMPLETE
HEIGHTINCHES: 79 in
Weight: 3520 oz

## 2016-02-28 LAB — LIPID PANEL
CHOL/HDL RATIO: 2.7 ratio
CHOLESTEROL: 186 mg/dL (ref 0–200)
HDL: 69 mg/dL (ref >40)
LDL Cholesterol: 108 mg/dL — ABNORMAL HIGH (ref 0–99)
TRIGLYCERIDES: 44 mg/dL (ref <150)
VLDL: 9 mg/dL (ref 0–40)

## 2016-02-28 MED ORDER — ACETAMINOPHEN 325 MG PO TABS
650.0000 mg | ORAL_TABLET | Freq: Four times a day (QID) | ORAL | Status: DC | PRN
  Administered 2016-02-28 – 2016-02-29 (×2): 650 mg via ORAL
  Filled 2016-02-28 (×2): qty 2

## 2016-02-28 MED ORDER — DIAZEPAM 2 MG PO TABS
2.0000 mg | ORAL_TABLET | Freq: Four times a day (QID) | ORAL | Status: DC | PRN
  Administered 2016-02-28: 2 mg via ORAL
  Filled 2016-02-28: qty 1

## 2016-02-28 MED ORDER — ATORVASTATIN CALCIUM 40 MG PO TABS
40.0000 mg | ORAL_TABLET | Freq: Every day | ORAL | Status: DC
  Administered 2016-02-28: 40 mg via ORAL
  Filled 2016-02-28 (×2): qty 1

## 2016-02-28 NOTE — Progress Notes (Signed)
Transcranial Bubble study has been completed.  Performed by Dr. Pearlean BrownieSethi. Verbal consent was taken and risks/ benefits explained. Right forearm IV site was used. Left middle cerebral artery was insonated.  + HITS heard at rest. ++ HITS heard during Valsalva.  Positive for Small PFO.    Farrel DemarkJill Eunice, RDMS, RVT 02/28/2016

## 2016-02-28 NOTE — Evaluation (Signed)
Physical Therapy Evaluation Patient Details Name: Jacob Lynch MRN: [id number] DOB: 1956/04/02 Today's Date: 02/28/2016   History of Present Illness  60 y.o.malewith medical history significant for hypertension and SVT. He reports that around 10:00 last night he became extremely dizzy with significant nausea and repeated episodes of vomiting. MRI on 10/5 + for moderate sized R PICA territory infarct, small chronic L cerebellar infarct.  Clinical Impression  Pt admitted with/for dizziness and nausea due to cerebellar infarct per MRI.  Pt currently limited functionally due to the problems listed below.  (see problems list.)  Pt will benefit from PT to maximize function and safety to be able to get home safely with available assist of family.     Follow Up Recommendations Outpatient PT    Equipment Recommendations  None recommended by PT    Recommendations for Other Services       Precautions / Restrictions Precautions Precautions: Fall Restrictions Weight Bearing Restrictions: No      Mobility  Bed Mobility Overal bed mobility: Modified Independent                Transfers Overall transfer level: Needs assistance Equipment used: None Transfers: Sit to/from Stand Sit to Stand: Supervision         General transfer comment: Mildly unsteady but no major LOB.  Ambulation/Gait Ambulation/Gait assistance: Supervision;Min guard Ambulation Distance (Feet): 600 Feet Assistive device: None Gait Pattern/deviations: Step-through pattern   Gait velocity interpretation: >2.62 ft/sec, indicative of independent community ambulator General Gait Details: mildly unsteady overall, but easily manages balance until challenged moderately then shows deviations with wandering, scissoring and mild staggering, but no unrecoverable LOB  Stairs Stairs: Yes Stairs assistance: Supervision Stair Management: One rail Right;Alternating pattern;Forwards Number of Stairs: 6 General stair  comments: safe with rail  Wheelchair Mobility    Modified Rankin (Stroke Patients Only) Modified Rankin (Stroke Patients Only) Pre-Morbid Rankin Score: No symptoms Modified Rankin: Moderate disability     Balance Overall balance assessment: Needs assistance Sitting-balance support: Feet supported;No upper extremity supported Sitting balance-Leahy Scale: Normal     Standing balance support: No upper extremity supported Standing balance-Leahy Scale: Good                   Standardized Balance Assessment Standardized Balance Assessment : Berg Balance Test;Dynamic Gait Index Berg Balance Test Sit to Stand: Able to stand without using hands and stabilize independently Standing Unsupported: Able to stand safely 2 minutes Sitting with Back Unsupported but Feet Supported on Floor or Stool: Able to sit safely and securely 2 minutes Stand to Sit: Sits safely with minimal use of hands Transfers: Able to transfer safely, minor use of hands Standing Unsupported with Eyes Closed: Able to stand 10 seconds safely Standing Ubsupported with Feet Together: Able to place feet together independently and stand 1 minute safely From Standing Position, Turn to Look Behind Over each Shoulder: Looks behind one side only/other side shows less weight shift Turn 360 Degrees: Able to turn 360 degrees safely in 4 seconds or less Dynamic Gait Index Level Surface: Normal Change in Gait Speed: Normal Gait with Horizontal Head Turns: Mild Impairment Gait with Vertical Head Turns: Mild Impairment Gait and Pivot Turn: Normal Step Over Obstacle: Mild Impairment Step Around Obstacles: Mild Impairment Steps: Mild Impairment Total Score: 19       Pertinent Vitals/Pain Pain Assessment: 0-10 Pain Score: 7  Pain Location: head Pain Descriptors / Indicators: Aching Pain Intervention(s): Monitored during session;Repositioned    Home Living Family/patient expects  to be discharged to:: Private  residence Living Arrangements: Spouse/significant other Available Help at Discharge: Family;Available 24 hours/day Type of Home: House       Home Layout: Multi-level Home Equipment: Shower seat - built in      Prior Function Level of Independence: Independent         Comments: working     Higher education careers adviser   Dominant Hand: Right    Extremity/Trunk Assessment   Upper Extremity Assessment: Overall WFL for tasks assessed           Lower Extremity Assessment: Overall WFL for tasks assessed      Cervical / Trunk Assessment: Normal  Communication   Communication: No difficulties  Cognition Arousal/Alertness: Awake/alert Behavior During Therapy: WFL for tasks assessed/performed Overall Cognitive Status: Within Functional Limits for tasks assessed                      General Comments General comments (skin integrity, edema, etc.): dynamically much more unsteady, but pt able to self recover    Exercises     Assessment/Plan    PT Assessment Patient needs continued PT services  PT Problem List Decreased balance;Decreased mobility;Decreased coordination          PT Treatment Interventions Gait training;Functional mobility training;Therapeutic activities;Balance training;Patient/family education    PT Goals (Current goals can be found in the Care Plan section)  Acute Rehab PT Goals Patient Stated Goal: get back to work and my normal activity PT Goal Formulation: With patient Time For Goal Achievement: 03/06/16 Potential to Achieve Goals: Good    Frequency Min 3X/week   Barriers to discharge        Co-evaluation       OT goals addressed during session: ADL's and self-care       End of Session   Activity Tolerance: Patient tolerated treatment well Patient left: in bed Nurse Communication: Mobility status         Time: 4696-2952 PT Time Calculation (min) (ACUTE ONLY): 44 min   Charges:   PT Evaluation $PT Eval Low Complexity: 1  Procedure PT Treatments $Gait Training: 8-22 mins   PT G Codes:        Ellyse Rotolo, Eliseo Gum 02/28/2016, 6:25 PM 02/28/2016  [place] Bing, PT [phone]  (pager)

## 2016-02-28 NOTE — Progress Notes (Signed)
OT Cancellation Note  Patient Details Name: Rock NephewRex Buskirk MRN: [id number] DOB: Aug 05, 1955   Cancelled Treatment:    Reason Eval/Treat Not Completed: Patient at procedure or test/ unavailable. Will follow up as time allows.  Gaye AlkenBailey A Bria Sparr M.S., OTR/L Pager: [phone]  02/28/2016, 8:48 AM

## 2016-02-28 NOTE — Progress Notes (Signed)
STROKE TEAM PROGRESS NOTE   HISTORY OF PRESENT ILLNESS (per record) Jacob Lynch is an 60 y.o. male with a history of hypertension who experienced acute onset of vertigo nausea and vomiting and unstable gait at 10 PM on 02/26/2016 (LKW). There was no focal weakness. He had no speech change and no diplopia. There is no previous history of stroke nor TIA. He has not been on antiplatelet therapy. CT scan and MRI showed findings consistent with acute right nonhemorrhagic PICA territory infarction. MR angiogram was unremarkable with no large vessel occlusion or significant stenosis. NIH stroke score the time of this evaluation was 0. He has continued to experience vertigo and nausea, although improved from last night. Patient was not administered IV t-PA secondary to Beyond time window for treatment consideration. He was admitted for further evaluation and treatment.   SUBJECTIVE (INTERVAL HISTORY) No family is at the bedside.  He is very healthy at baseline, works out 30 mins a day, eats healthy. "No offense, but I never wanted to meet you guys". Reports sudden onset dizziness with nausea and vomiting over night. He called wife, she came down from [place], [place]. Decided to come to ED. Reports nausea is improving. Still dizzy when riding in the wheel chair. Vomiting has resolved. Denies recent trauma to head or neck. No weight lifting in the gym. He works as a Research scientist (medical). Overall he feels his condition is gradually improving.    OBJECTIVE Temp:  [97.9 F (36.6 C)-99 F (37.2 C)] 99 F (37.2 C) (10/05 1510) Pulse Rate:  [65-78] 69 (10/05 1510) Cardiac Rhythm: Heart block (10/05 1522) Resp:  [11-20] 17 (10/05 1510) BP: (143-159)/(67-93) 148/67 (10/05 1510) SpO2:  [97 %-100 %] 100 % (10/05 1510) Weight:  [99.8 kg (220 lb)] 99.8 kg (220 lb) (10/05 1021)  CBC:   Recent Labs Lab 02/27/16 1050  WBC 7.2  NEUTROABS 6.2  HGB 13.9  HCT 40.2  MCV 96.9  PLT 183    Basic Metabolic Panel:   Recent  Labs Lab 02/27/16 1050  NA 140  K 3.6  CL 106  CO2 25  GLUCOSE 146*  BUN 22*  CREATININE 0.71  CALCIUM 9.3    Lipid Panel:     Component Value Date/Time   CHOL 186 02/28/2016 0552   TRIG 44 02/28/2016 0552   HDL 69 02/28/2016 0552   CHOLHDL 2.7 02/28/2016 0552   VLDL 9 02/28/2016 0552   LDLCALC 108 (H) 02/28/2016 0552   HgbA1c: No results found for: HGBA1C Urine Drug Screen: No results found for: LABOPIA, COCAINSCRNUR, LABBENZ, AMPHETMU, THCU, LABBARB    IMAGING  Ct Head Wo Contrast 02/27/2016 Positive for findings of a subacute infarct in the right inferior cerebellar hemisphere in the region of the posterior inferior cerebellar artery (PICA). Cerebral cortical volume loss.   Mr Brain 87 Contrast Mr Maxine Glenn Head Wo Contrast 02/27/2016 1. Confluent moderate-sized right PICA territory infarct with cerebellar edema and petechial hemorrhage, but no malignant hemorrhagic transformation. 2. There is mild mass effect on the fourth ventricle, but no ventriculomegaly and other basilar cisterns are patent. 3. Patent non dominant distal right vertebral artery. The right PICA origin also appears to be patent. 4. No other acute infarct, but there is a small chronic left cerebellar infarct. 5. Generalized intracranial artery dolichoectasia.   Carotid Doppler   There is 1-39% bilateral ICA stenosis. Vertebral artery flow is antegrade.    PHYSICAL EXAM Pleasant middle aged Caucasian male not in distress.  . Afebrile. Head is nontraumatic.  Neck is supple without bruit.    Cardiac exam no murmur or gallop. Lungs are clear to auscultation. Distal pulses are well felt. Neurological Exam ;  Awake  Alert oriented x 3. Normal speech and language.eye movements full without nystagmus.fundi were not visualized. Vision acuity and fields appear normal. Hearing is normal. Palatal movements are normal. Face symmetric. Tongue midline. Normal strength, tone, reflexes and coordination. Normal sensation.  Gait deferred.  ASSESSMENT/PLAN Jacob Lynch is a 60 y.o. male with history of HTN and SVT presenting with acute onset of vertigo, nausea and unstable gait. He did not receive IV t-PA due to being beyond time window for treatment consideration .  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 Mild mass effect 4th ventricle. 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 If negative, loop recorder recommended to asses for atrial fibrillation as possible source of stroke. If he remains hospitalized here, can do next week by contacting cardiology. If stable for discharge, stroke team ok with follow up with stroke care provider near [place] in [place].  Hypertension  Home meds:  On lisinopril  BP stable Permissive hypertension (OK if < 220/120) but gradually normalize in 5-7 days  Long-term BP goal normotensive  Hyperlipidemia  Home meds:  No statin  LDL 108, goal < 70  Now on lipitor 40  Continue statin at discharge  Other Stroke Risk Factors  ETOH use, advised to drink no more than 2 drink(s) a day  Other Active  Problems  Paroxysmal SVT  Hospital day # 1  Rhoderick MoodyBIBY,SHARON  [facility] See Amion for Pager information 02/28/2016 9:58 AM  I have personally examined this patient, reviewed notes, independently viewed imaging studies, participated in medical decision making and plan of care.ROS completed by me personally and pertinent positives fully documented  I have made any additions or clarifications directly to the above note. Agree with note above.Recommend aspirin. [place] from [place] and wants to complete remaining work up-TEE and loop recorder closer to home and will need o coordinate with his primary Md there.I had along d/w patient and spoke o his wife over the phone and explained plan for evaluation, treatment and answered questions.Greater than 50% time during this 35 minute visit was spent on counselling and coordination of care about stroke risk, prevention and treatment. Delia HeadyPramod Tenessa Marsee, MD Medical Director [facility] Pager: [phone] 02/28/2016 5:38 PM  To contact Stroke Continuity provider, please refer to [email or web address]. After hours, contact General Neurology

## 2016-02-28 NOTE — Progress Notes (Signed)
VASCULAR LAB PRELIMINARY  PRELIMINARY  PRELIMINARY  PRELIMINARY  Carotid duplex completed.    Preliminary report:  1-39% ICA plaquing.  Vertebral artery flow is antegrade.  Left ICA is tortuous.  Said Rueb, RVT 02/28/2016, 9:35 AM

## 2016-02-28 NOTE — Progress Notes (Signed)
*  PRELIMINARY RESULTS* Vascular Ultrasound Lower extremity venous duplex has been completed.  Preliminary findings: No evidence of DVT or baker's cyst.  Farrel DemarkJill Eunice, RDMS, RVT  02/28/2016, 3:34 PM

## 2016-02-28 NOTE — Progress Notes (Signed)
  Echocardiogram 2D Echocardiogram has been performed.  Jacob SavoyCasey Lynch Jacob Lynch 02/28/2016, 9:34 AM

## 2016-02-28 NOTE — Care Management Note (Signed)
Case Management Note  Patient Details  Name: Jacob Lynch MRN: [id number] Date of Birth: 10/29/1955  Subjective/Objective:    Pt admitted with CVA. He is from home with his spouse.                 Action/Plan: Awaiting PT/OT recommendations. CM following for d/c needs.   Expected Discharge Date:                  Expected Discharge Plan:     In-House Referral:     Discharge planning Services     Post Acute Care Choice:    Choice offered to:     DME Arranged:    DME Agency:     HH Arranged:    HH Agency:     Status of Service:  In process, will continue to follow  If discussed at Long Length of Stay Meetings, dates discussed:    Additional Comments:  Kermit BaloKelli F Jakayden Cancio, RN 02/28/2016, 10:56 AM

## 2016-02-28 NOTE — Progress Notes (Signed)
PROGRESS NOTE    Jacob Lynch  Jacob Lynch Jacob Lynch DOA: 02/27/2016 PCP: No PCP Per Patient   Outpatient Specialists:     Brief Narrative:  Jacob Lynch is a 60 y.o. male with medical history significant for hypertension and SVT. Patient works as a Research scientist (medical) and had traveled from  to [place] to conduct business. He did fly via [employer] but was not on the plane longer than 2 hours. He has not taken any international flights recently. He reports that around 10:00 last night he became extremely dizzy with significant nausea and repeated episodes of vomiting. He did not recall any vertigo symptoms. He did not recall any specific visual field changes, blurred vision or diplopia. He was driven by a worker from [place] to [place] and brought to the ER here for further evaluation and treatment. Patient does report that his father has a history of CVA.    Assessment & Plan:   Principal Problem:   Cerebellar stroke (HCC) Active Problems:   Nausea with vomiting   HTN (hypertension)   Acute hyperglycemia   Paroxysmal SVT (supraventricular tachycardia) (HCC)   Cerebellar stroke  -Patient presents with persistent dizziness with nausea and vomiting with CT revealing subacute cerebellar stroke; no other focal neurological symptoms and has presented outside of window for TPA -Echocardiogram -Carotid duplex -asa -PT/OT/SLP evaluation -Hgba1c  LDL 108 -Neuro checks per protocol -duplex LE -TCD bubble study -will need TEE/loop recorder in [place]  Dizziness Valium PRN     Nausea with vomiting -Secondary to cerebellar stroke -Appears to have associated dehydration so will give IV fluids until able to tolerate POs    Acute hyperglycemia -Follow-up on hemoglobin A1c    HTN (hypertension) -Allow permissive hypertension -Hold preadmission lisinopril    Paroxysmal SVT (supraventricular tachycardia)  -Remote history of and no recent history of  palpitations -Not on rate controlling agents prior to admission      DVT prophylaxis:  Lovenox   Code Status: Full Code   Family Communication: patient  Disposition Plan:  Home in AM   Consultants:   neuro      Subjective: Feeling better Would prefer to finish work up--- TEE[place]loop recorder in [place]  Objective: Vitals:   02/27/16 1342 02/27/16 1345 02/27/16 1354 02/27/16 1510  BP: 146/76  146/75 (!) 148/67  Pulse: 70 70 78 69  Resp: 18  20 17   Temp: 98.5 F (36.9 C)   99 F (37.2 C)  TempSrc: Oral   Oral  SpO2: 100% 97% 100% 100%  Weight:      Height:        Intake/Output Summary (Last 24 hours) at 02/28/16 1020 Last data filed at 02/28/16 0500  Gross per 24 hour  Intake          2311.67 ml  Output                0 ml  Net          2311.67 ml   Filed Weights   02/27/16 1021  Weight: 99.8 kg (220 lb)    Examination:  General exam: Appears calm and comfortable  Respiratory system: Clear to auscultation. Respiratory effort normal. Cardiovascular system: S1 & S2 heard, RRR. No JVD, murmurs, rubs, gallops or clicks. No pedal edema. Gastrointestinal system: Abdomen is nondistended, soft and nontender. No organomegaly or masses felt. Normal bowel sounds heard. Central nervous system: Alert and oriented. No focal neurological deficits. Extremities: Symmetric 5 x 5 power. Skin: No rashes, lesions or  ulcers Psychiatry: Judgement and insight appear normal. Mood & affect appropriate.     Data Reviewed: I have personally reviewed following labs and imaging studies  CBC:  Recent Labs Lab 02/27/16 1050  WBC 7.2  NEUTROABS 6.2  HGB 13.9  HCT 40.2  MCV 96.9  PLT 183   Basic Metabolic Panel:  Recent Labs Lab 02/27/16 1050  NA 140  K 3.6  CL 106  CO2 25  GLUCOSE 146*  BUN 22*  CREATININE 0.71  CALCIUM 9.3   GFR: Estimated Creatinine Clearance: 131.8 mL/min (by C-G formula based on SCr of 0.71 mg/dL). Liver Function Tests: No results for  input(s): AST, ALT, ALKPHOS, BILITOT, PROT, ALBUMIN in the last 168 hours. No results for input(s): LIPASE, AMYLASE in the last 168 hours. No results for input(s): AMMONIA in the last 168 hours. Coagulation Profile: No results for input(s): INR, PROTIME in the last 168 hours. Cardiac Enzymes: No results for input(s): CKTOTAL, CKMB, CKMBINDEX, TROPONINI in the last 168 hours. BNP (last 3 results) No results for input(s): PROBNP in the last 8760 hours. HbA1C: No results for input(s): HGBA1C in the last 72 hours. CBG: No results for input(s): GLUCAP in the last 168 hours. Lipid Profile:  Recent Labs  02/28/16 0552  CHOL 186  HDL 69  LDLCALC 108*  TRIG 44  CHOLHDL 2.7   Thyroid Function Tests: No results for input(s): TSH, T4TOTAL, FREET4, T3FREE, THYROIDAB in the last 72 hours. Anemia Panel: No results for input(s): VITAMINB12, FOLATE, FERRITIN, TIBC, IRON, RETICCTPCT in the last 72 hours. Urine analysis: No results found for: COLORURINE, APPEARANCEUR, LABSPEC, PHURINE, GLUCOSEU, HGBUR, BILIRUBINUR, KETONESUR, PROTEINUR, UROBILINOGEN, NITRITE, LEUKOCYTESUR   )No results found for this or any previous visit (from the past 240 hour(s)).    Anti-infectives    None       Radiology Studies: Ct Head Wo Contrast  Result Date: 02/27/2016 CLINICAL DATA:  60 year old male with vertigo, nausea and vomiting. EXAM: CT HEAD WITHOUT CONTRAST TECHNIQUE: Contiguous axial images were obtained from the base of the skull through the vertex without intravenous contrast. COMPARISON:  None. FINDINGS: Brain: There is a region of relative hypoattenuation in the right cerebellar hemisphere extending to the posteromedial cortex and the vermis. No evidence of hemorrhagic transformation. There is mild blurring of the adjacent gray-white interface. No definite extension to the medulla by CT imaging. Very mild cerebral cortical volume loss. No significant chronic microvascular ischemic white matter  changes. No mass lesion, mass effect or hydrocephalus. Vascular: No hyperdense vessel or unexpected calcification. Skull: Normal. Negative for fracture or focal lesion. Sinuses/Orbits: No acute finding. Other: None. IMPRESSION: Positive for findings of a subacute infarct in the right inferior cerebellar hemisphere in the region of the posterior inferior cerebellar artery (PICA). Cerebral cortical volume loss. These results were called by telephone at the time of interpretation on 02/27/2016 at 11:50 am to Dr. Rolland PorterMARK JAMES , who verbally acknowledged these results. Electronically Signed   By: Malachy MoanHeath  McCullough M.D.   On: 02/27/2016 11:50   Mr Shirlee LatchMra Head ZOWo Contrast  Result Date: 02/27/2016 CLINICAL DATA:  60 year old male with acute onset dizziness nausea and vomiting 2200 hours yesterday. Evidence of cerebellar infarct on noncontrast head CT today. Initial encounter. EXAM: MRI HEAD WITHOUT CONTRAST MRA HEAD WITHOUT CONTRAST TECHNIQUE: Multiplanar, multiecho pulse sequences of the brain and surrounding structures were obtained without intravenous contrast. Angiographic images of the head were obtained using MRA technique without contrast. COMPARISON:  Head CT 1140 hours. FINDINGS: MRI HEAD  FINDINGS Brain: Moderate size (5.5 x 4 cm) area of confluent restricted diffusion in the inferior right cerebellum, extending from the lateral and posterior cerebellar tonsil to the right cerebellar vermis. Cerebellar edema with mild petechial hemorrhage (series 9, image 4) and mild mass effect on the fourth ventricle. Basilar cisterns remain patent. No ventriculomegaly or transependymal edema. No other restricted diffusion. However, there is a small chronic posterior left cerebellar infarct (series 7, image 5). Negative brainstem. Elsewhere there is minimal nonspecific cerebral white matter T2 and FLAIR hyperintensity. No supratentorial cortical encephalomalacia or chronic cerebral blood products. No midline shift, evidence of mass  lesion, extra-axial collection or acute intracranial hemorrhage. Cervicomedullary junction and pituitary are within normal limits. Vascular: Major intracranial vascular flow voids are preserved, the distal left vertebral artery appears dominant and is somewhat dolichoectatic. There is also a degree of anterior circulation dolichoectasia. Skull and upper cervical spine: Negative. Normal bone marrow signal. Sinuses/Orbits: Normal orbit soft tissues. Visualized paranasal sinuses and mastoids are stable and well pneumatized. Other: Visible internal auditory structures appear normal. Negative scalp soft tissues. MRA HEAD FINDINGS Symmetric appearing antegrade flow in both distal cervical vertebral artery is, the left is dominant. There does appear to be preserved flow signal at the right PICA origin. Decreased signal in the right V4 segment distal to the PICA might be artifact due to its horizontal course. The vertebrobasilar junction is patent. The basilar artery is patent without stenosis. Normal SCA and left PCA origins. Fetal type right PCA origin. The left posterior communicating artery is present. Bilateral PCA branches are normal. Antegrade flow in both ICA siphons. Tortuous siphons with no stenosis. Normal ophthalmic and posterior communicating artery origins. Patent carotid termini, MCA and ACA origins. Anterior communicating artery and visualized ACA branches are within normal limits (there is a median artery of the corpus callosum, normal variant). Tortuous but otherwise normal MCA M1 segments. Patent MCA bifurcations. Visualized bilateral MCA branches are within normal limits. IMPRESSION: 1. Confluent moderate-sized right PICA territory infarct with cerebellar edema and petechial hemorrhage, but no malignant hemorrhagic transformation. 2. There is mild mass effect on the fourth ventricle, but no ventriculomegaly and other basilar cisterns are patent. 3. Patent non dominant distal right vertebral artery. The  right PICA origin also appears to be patent. 4. No other acute infarct, but there is a small chronic left cerebellar infarct. 5. Generalized intracranial artery dolichoectasia. Electronically Signed   By: Odessa Fleming M.D.   On: 02/27/2016 19:36   Mr Brain Wo Contrast  Result Date: 02/27/2016 CLINICAL DATA:  60 year old male with acute onset dizziness nausea and vomiting 2200 hours yesterday. Evidence of cerebellar infarct on noncontrast head CT today. Initial encounter. EXAM: MRI HEAD WITHOUT CONTRAST MRA HEAD WITHOUT CONTRAST TECHNIQUE: Multiplanar, multiecho pulse sequences of the brain and surrounding structures were obtained without intravenous contrast. Angiographic images of the head were obtained using MRA technique without contrast. COMPARISON:  Head CT 1140 hours. FINDINGS: MRI HEAD FINDINGS Brain: Moderate size (5.5 x 4 cm) area of confluent restricted diffusion in the inferior right cerebellum, extending from the lateral and posterior cerebellar tonsil to the right cerebellar vermis. Cerebellar edema with mild petechial hemorrhage (series 9, image 4) and mild mass effect on the fourth ventricle. Basilar cisterns remain patent. No ventriculomegaly or transependymal edema. No other restricted diffusion. However, there is a small chronic posterior left cerebellar infarct (series 7, image 5). Negative brainstem. Elsewhere there is minimal nonspecific cerebral white matter T2 and FLAIR hyperintensity. No supratentorial cortical encephalomalacia  or chronic cerebral blood products. No midline shift, evidence of mass lesion, extra-axial collection or acute intracranial hemorrhage. Cervicomedullary junction and pituitary are within normal limits. Vascular: Major intracranial vascular flow voids are preserved, the distal left vertebral artery appears dominant and is somewhat dolichoectatic. There is also a degree of anterior circulation dolichoectasia. Skull and upper cervical spine: Negative. Normal bone marrow  signal. Sinuses/Orbits: Normal orbit soft tissues. Visualized paranasal sinuses and mastoids are stable and well pneumatized. Other: Visible internal auditory structures appear normal. Negative scalp soft tissues. MRA HEAD FINDINGS Symmetric appearing antegrade flow in both distal cervical vertebral artery is, the left is dominant. There does appear to be preserved flow signal at the right PICA origin. Decreased signal in the right V4 segment distal to the PICA might be artifact due to its horizontal course. The vertebrobasilar junction is patent. The basilar artery is patent without stenosis. Normal SCA and left PCA origins. Fetal type right PCA origin. The left posterior communicating artery is present. Bilateral PCA branches are normal. Antegrade flow in both ICA siphons. Tortuous siphons with no stenosis. Normal ophthalmic and posterior communicating artery origins. Patent carotid termini, MCA and ACA origins. Anterior communicating artery and visualized ACA branches are within normal limits (there is a median artery of the corpus callosum, normal variant). Tortuous but otherwise normal MCA M1 segments. Patent MCA bifurcations. Visualized bilateral MCA branches are within normal limits. IMPRESSION: 1. Confluent moderate-sized right PICA territory infarct with cerebellar edema and petechial hemorrhage, but no malignant hemorrhagic transformation. 2. There is mild mass effect on the fourth ventricle, but no ventriculomegaly and other basilar cisterns are patent. 3. Patent non dominant distal right vertebral artery. The right PICA origin also appears to be patent. 4. No other acute infarct, but there is a small chronic left cerebellar infarct. 5. Generalized intracranial artery dolichoectasia. Electronically Signed   By: Odessa Fleming M.D.   On: 02/27/2016 19:36        Scheduled Meds: . aspirin  300 mg Rectal Daily   Or  . aspirin  325 mg Oral Daily  . enoxaparin (LOVENOX) injection  40 mg Subcutaneous Q24H    . metoCLOPramide (REGLAN) injection  5 mg Intravenous Q6H  . ondansetron (ZOFRAN) IV  4 mg Intravenous Q6H   Continuous Infusions: . sodium chloride Stopped (02/28/16 0721)     LOS: 1 day    Time spent: 35 min    JESSICA U VANN, DO [facility] Pager [phone]  If 7PM-7AM, please contact night-coverage [email or web address] Password [email or web address] 02/28/2016, 10:20 AM

## 2016-02-28 NOTE — Progress Notes (Signed)
Speech Language Pathology Patient Details Name: Rock NephewRex Stmarie MRN: [id number] DOB: [birth year] Today's Date: 02/28/2016 Time:  -     Speech-language-cognitive abilities screened to be functional. No formal assessment is warranted.                             Royce MacadamiaLitaker, Reverie Vaquera Willis 02/28/2016, 4:39 PM

## 2016-02-28 NOTE — Evaluation (Signed)
Occupational Therapy Evaluation Patient Details Name: Jacob Lynch MRN: [id number] DOB: 1955-10-13 Today's Date: 02/28/2016    History of Present Illness 60 y.o.malewith medical history significant for hypertension and SVT. He reports that around 10:00 last night he became extremely dizzy with significant nausea and repeated episodes of vomiting. MRI on 10/5 + for moderate sized R PICA territory infarct, small chronic L cerebellar infarct.   Clinical Impression   Pt reports he was independent with ADL PTA. Currently pt overall supervision with ADL and functional mobility due to mild unsteadiness/balance deficits in standing. Pt planning to d/c home with 24/7 supervision from his wife. No further acute OT needs identified; signing off at this time. Please re-consult if needs change. Thank you for this referral.    Follow Up Recommendations  No OT follow up;Supervision - Intermittent    Equipment Recommendations  None recommended by OT    Recommendations for Other Services       Precautions / Restrictions Precautions Precautions: Fall Restrictions Weight Bearing Restrictions: No      Mobility Bed Mobility Overal bed mobility: Modified Independent                Transfers Overall transfer level: Needs assistance Equipment used: None Transfers: Sit to/from Stand Sit to Stand: Supervision         General transfer comment: Mildly unsteady but no major LOB.    Balance Overall balance assessment: Needs assistance Sitting-balance support: Feet supported;No upper extremity supported Sitting balance-Leahy Scale: Normal     Standing balance support: No upper extremity supported Standing balance-Leahy Scale: Good                              ADL Overall ADL's : Needs assistance/impaired Eating/Feeding: Modified independent   Grooming: Supervision/safety;Standing   Upper Body Bathing: Supervision/ safety;Standing   Lower Body Bathing:  Supervison/ safety   Upper Body Dressing : Supervision/safety   Lower Body Dressing: Supervision/safety   Toilet Transfer: Supervision/safety;Ambulation;Regular Teacher, adult education Details (indicate cue type and reason): Simulated by sit to stand from EOB with functional mobility         Functional mobility during ADLs: Supervision/safety General ADL Comments: Mild unsteadiness in standing but no major LOB. Educated pt and wife on slowly increasing activity, signs and symptoms of stroke.     Vision Vision Assessment?: No apparent visual deficits   Perception     Praxis      Pertinent Vitals/Pain Pain Assessment: 0-10 Pain Score: 3  Pain Location: head Pain Descriptors / Indicators: Constant;Aching Pain Intervention(s): Monitored during session     Hand Dominance Right   Extremity/Trunk Assessment Upper Extremity Assessment Upper Extremity Assessment: Overall WFL for tasks assessed   Lower Extremity Assessment Lower Extremity Assessment: Defer to PT evaluation   Cervical / Trunk Assessment Cervical / Trunk Assessment: Normal   Communication Communication Communication: No difficulties   Cognition Arousal/Alertness: Awake/alert Behavior During Therapy: WFL for tasks assessed/performed Overall Cognitive Status: Within Functional Limits for tasks assessed                     General Comments       Exercises       Shoulder Instructions      Home Living Family/patient expects to be discharged to:: Private residence Living Arrangements: Spouse/significant other Available Help at Discharge: Family;Available 24 hours/day Type of Home: House       Home Layout: Multi-level  Bathroom Shower/Tub: Producer, television/film/videoWalk-in shower   Bathroom Toilet: Standard     Home Equipment: Information systems managerhower seat - built in          Prior Functioning/Environment Level of Independence: Independent        Comments: working        OT Problem List:     OT  Treatment/Interventions:      OT Goals(Current goals can be found in the care plan section) Acute Rehab OT Goals OT Goal Formulation: All assessment and education complete, DC therapy  OT Frequency:     Barriers to D/C:            Co-evaluation PT/OT/SLP Co-Evaluation/Treatment: Yes     OT goals addressed during session: ADL's and self-care      End of Session Nurse Communication: Mobility status  Activity Tolerance: Patient tolerated treatment well Patient left: in bed;with call bell/phone within reach;with nursing/sitter in room;with family/visitor present   Time: [id number] OT Time Calculation (min): 44 min Charges:  OT General Charges $OT Visit: 1 Procedure OT Evaluation $OT Eval Low Complexity: 1 Procedure G-Codes:     Gaye AlkenBailey A Margrete Delude M.S., OTR/L Pager: [phone]  02/28/2016, 5:49 PM

## 2016-02-29 LAB — BASIC METABOLIC PANEL
Anion gap: 8 (ref 5–15)
BUN: 22 mg/dL — AB (ref 6–20)
CALCIUM: 8.4 mg/dL — AB (ref 8.9–10.3)
CO2: 25 mmol/L (ref 22–32)
CREATININE: 0.89 mg/dL (ref 0.61–1.24)
Chloride: 106 mmol/L (ref 101–111)
GFR calc Af Amer: >60 mL/min (ref >60)
GFR calc non Af Amer: >60 mL/min (ref >60)
GLUCOSE: 107 mg/dL — AB (ref 65–99)
Potassium: 3.5 mmol/L (ref 3.5–5.1)
Sodium: 139 mmol/L (ref 135–145)

## 2016-02-29 LAB — VAS US CAROTID
LCCADDIAS: -17 cm/s
LCCADSYS: -83 cm/s
LCCAPDIAS: 19 cm/s
LEFT ECA DIAS: -7 cm/s
LEFT VERTEBRAL DIAS: -19 cm/s
LICADDIAS: -21 cm/s
LICADSYS: -60 cm/s
LICAPDIAS: -21 cm/s
Left CCA prox sys: 118 cm/s
Left ICA prox sys: -55 cm/s
RCCAPSYS: 85 cm/s
RIGHT ECA DIAS: -15 cm/s
RIGHT VERTEBRAL DIAS: -21 cm/s
Right CCA prox dias: 23 cm/s
Right cca dist sys: -44 cm/s

## 2016-02-29 LAB — HEMOGLOBIN A1C
Hgb A1c MFr Bld: 5.3 % (ref 4.8–5.6)
Mean Plasma Glucose: 105 mg/dL

## 2016-02-29 LAB — CBC
HEMATOCRIT: 37.6 % — AB (ref 39.0–52.0)
Hemoglobin: 12.3 g/dL — ABNORMAL LOW (ref 13.0–17.0)
MCH: 32.5 pg (ref 26.0–34.0)
MCHC: 32.7 g/dL (ref 30.0–36.0)
MCV: 99.5 fL (ref 78.0–100.0)
PLATELETS: 158 10*3/uL (ref 150–400)
RBC: 3.78 MIL/uL — ABNORMAL LOW (ref 4.22–5.81)
RDW: 13.1 % (ref 11.5–15.5)
WBC: 7.4 10*3/uL (ref 4.0–10.5)

## 2016-02-29 MED ORDER — ATORVASTATIN CALCIUM 40 MG PO TABS: 40.0000 mg | ORAL_TABLET | Freq: Every day | ORAL | 0 refills | Status: AC

## 2016-02-29 MED ORDER — ASPIRIN-ACETAMINOPHEN-CAFFEINE 250-250-65 MG PO TABS
1.0000 | ORAL_TABLET | Freq: Once | ORAL | Status: AC
  Administered 2016-02-29: 1 via ORAL
  Filled 2016-02-29: qty 1

## 2016-02-29 MED ORDER — LISINOPRIL 20 MG PO TABS: 20.0000 mg | ORAL_TABLET | Freq: Every day | ORAL | Status: DC

## 2016-02-29 MED ORDER — ASPIRIN 325 MG PO TABS: 325.0000 mg | ORAL_TABLET | Freq: Every day | ORAL | 0 refills | Status: AC

## 2016-02-29 NOTE — Progress Notes (Signed)
STROKE TEAM PROGRESS NOTE   HISTORY OF PRESENT ILLNESS (per record) Jacob Lynch is an 60 y.o. male with a history of hypertension who experienced acute onset of vertigo nausea and vomiting and unstable gait at 10 PM on 02/26/2016 ([facility]). There was no focal weakness. He had no speech change and no diplopia. There is no previous history of stroke nor TIA. He has not been on antiplatelet therapy. CT scan and MRI showed findings consistent with acute right nonhemorrhagic PICA territory infarction. MR angiogram was unremarkable with no large vessel occlusion or significant stenosis. NIH stroke score the time of this evaluation was 0. He has continued to experience vertigo and nausea, although improved from last night. Patient was not administered IV t-PA secondary to Beyond time window for treatment consideration. He was admitted for further evaluation and treatment.   SUBJECTIVE (INTERVAL HISTORY) His wife is at the bedside.   Patient feels he is doing pretty well and is ready for discharge. He and his wife had a lot of questions about its post discharge care and coordination of ongoing stroke evaluation including TEE, possible loop recorder insertion and PFO closure. OBJECTIVE Temp:  [97.7 F (36.5 C)-98.8 F (37.1 C)] 97.9 F (36.6 C) (10/07 1048) Pulse Rate:  [55-69] 55 (10/07 1048) Cardiac Rhythm: Normal sinus rhythm (10/07 0830) Resp:  [16-20] 18 (10/07 1048) BP: (131-143)/(74-91) 143/78 (10/07 1048) SpO2:  [97 %-99 %] 97 % (10/07 1048)  CBC:   Recent Labs Lab 02/27/16 1050 02/29/16 0152  WBC 7.2 7.4  NEUTROABS 6.2  --   HGB 13.9 12.3*  HCT 40.2 37.6*  MCV 96.9 99.5  PLT 183 158    Basic Metabolic Panel:   Recent Labs Lab 02/27/16 1050 02/29/16 0152  NA 140 139  K 3.6 3.5  CL 106 106  CO2 25 25  GLUCOSE 146* 107*  BUN 22* 22*  CREATININE 0.71 0.89  CALCIUM 9.3 8.4*    Lipid Panel:     Component Value Date/Time   CHOL 186 02/28/2016 0552   TRIG 44 02/28/2016 0552    HDL 69 02/28/2016 0552   CHOLHDL 2.7 02/28/2016 0552   VLDL 9 02/28/2016 0552   LDLCALC 108 (H) 02/28/2016 0552   HgbA1c:  Lab Results  Component Value Date   HGBA1C 5.3 02/28/2016   Urine Drug Screen: No results found for: LABOPIA, COCAINSCRNUR, LABBENZ, AMPHETMU, THCU, LABBARB    IMAGING  Ct Head Wo Contrast 02/27/2016 Positive for findings of a subacute infarct in the right inferior cerebellar hemisphere in the region of the posterior inferior cerebellar artery (PICA). Cerebral cortical volume loss.   Mr Brain 7 Contrast Mr Maxine Glenn Head Wo Contrast 02/27/2016 1. Confluent moderate-sized right PICA territory infarct with cerebellar edema and petechial hemorrhage, but no malignant hemorrhagic transformation. 2. There is mild mass effect on the fourth ventricle, but no ventriculomegaly and other basilar cisterns are patent. 3. Patent non dominant distal right vertebral artery. The right PICA origin also appears to be patent. 4. No other acute infarct, but there is a small chronic left cerebellar infarct. 5. Generalized intracranial artery dolichoectasia.   Carotid Doppler   There is 1-39% bilateral ICA stenosis. Vertebral artery flow is antegrade.   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  . Afebrile. Head is nontraumatic. Neck is supple without bruit.    Cardiac exam no murmur or gallop. Lungs are clear to auscultation. Distal pulses are well  felt. Neurological Exam ;  Awake  Alert oriented x 3. Normal speech and language.eye movements full without nystagmus.fundi were not visualized. Vision acuity and fields appear normal. Hearing is normal. Palatal movements are normal. Face symmetric. Tongue midline. Normal strength, tone, reflexes and coordination. Normal sensation. Gait deferred.  ASSESSMENT/PLAN Jacob Lynch is a 60 y.o. male with history of HTN and SVT presenting with acute onset of  vertigo, nausea and unstable gait. He did not receive IV t-PA due to being beyond time window for treatment consideration .  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 Mild mass effect 4th ventricle. 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 Wall thickness was   increased in a pattern of mild LVH. Systolic function was normal.   The estimated ejection fraction was in the range of 60% to 65%.   Left ventricular diastolic function parameters were normal. - Aorta: Shadowing artifact in arch on supra sternal notch images. - Left atrium: The atrium was mildly dilated.  - Atrial septum: No defect or patent foramen ovale was identified  LDL 108  HgbA1c 5.3  Lovenox 40 mg sq daily for VTE prophylaxis Diet Heart Room service appropriate? 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 If negative, loop recorder recommended to asses for atrial fibrillation as possible source of stroke. If he remains hospitalized here, can do next week by contacting cardiology. If stable for discharge, stroke team ok with follow up with stroke care provider near his home in [place], [place].  Hypertension  Home meds:  On lisinopril  BP stable  Permissive hypertension (OK if < 220/120) but gradually normalize in 5-7 days  Long-term BP goal normotensive  Hyperlipidemia  Home meds:  No statin  LDL 108, goal < 70  Now on lipitor 40  Continue statin at discharge  Other Stroke Risk Factors  ETOH use, advised to drink no more than 2 drink(s) a day  Other Active Problems  Paroxysmal SVT  Hospital day # 2   I have personally examined this patient, reviewed notes, independently viewed imaging studies, participated in medical decision making and plan of care.ROS completed by me personally and pertinent positives fully documented  I have made any additions or clarifications directly to the above note.e.Recommend aspirin and lipitor. Patient is visiting from [place] and wants to complete remaining work up-TEE and loop recorder closer to home and will need o coordinate with his primary MD there. He'll also need referral to see a cardiologist to discuss endovascular PFO closure after TEE confirms it.I had along d/w patient and his his wife over the phone and explained plan for evaluation, treatment and answered questions.Greater than 50% time during this 35 minute visit was spent on counselling and coordination of care about stroke  risk, prevention and treatment. Delia HeadyPramod Sethi, MD Medical Director [facility] Pager: [phone] 02/29/2016 1:31 PM  To contact Stroke Continuity provider, please refer to [email or web address]. After hours, contact General Neurology

## 2016-02-29 NOTE — Progress Notes (Signed)
Physical Therapy Treatment Patient Details Name: Jacob Lynch MRN: [id number] DOB: 1956-04-27 Today's Date: 02/29/2016    History of Present Illness 60 y.o.malewith medical history significant for hypertension and SVT. He reports that around 10:00 last night he became extremely dizzy with significant nausea and repeated episodes of vomiting. MRI on 10/5 + for moderate sized R PICA territory infarct, small chronic L cerebellar infarct.    PT Comments    Nursing staff asked therapist to ensure pt safe to discharge related to balance and mobility.  Instructed pt in compensatory strategies to manage symptoms of mild dizziness with head turns.  He demo min loss of balance during dynamic gait tasks but he is able to self-recover without assistance. He has good family support at home.  He will discuss with his MD upon returning home if he continues to need OPPT.  Feel pt at level acceptable to return home (wife will drive).   Follow Up Recommendations  Outpatient PT     Equipment Recommendations  None recommended by PT    Recommendations for Other Services       Precautions / Restrictions Precautions Precautions: Fall Precaution Comments: mild LOB with head turns during gait Restrictions Weight Bearing Restrictions: No    Mobility  Bed Mobility               General bed mobility comments: OOB upon arrival to room  Transfers Overall transfer level: Independent Equipment used: None Transfers: Sit to/from [employer] to Stand: Independent Stand pivot transfers: Supervision       General transfer comment: used compensatory strategies to turn in place and manage symptoms  Ambulation/Gait Ambulation/Gait assistance: Supervision Ambulation Distance (Feet): 500 Feet Assistive device: None Gait Pattern/deviations: Staggering right     General Gait Details: min disruption to smooth walking pattern with environmental scanning during gait, instructed in  compensatory strategies to manage symptoms   Stairs            Wheelchair Mobility    Modified Rankin (Stroke Patients Only) Modified Rankin (Stroke Patients Only) Pre-Morbid Rankin Score: No symptoms Modified Rankin: No significant disability     Balance Overall balance assessment: Needs assistance Sitting-balance support: No upper extremity supported;Feet supported Sitting balance-Leahy Scale: Normal     Standing balance support: No upper extremity supported Standing balance-Leahy Scale: Good   Single Leg Stance - Right Leg: 11.01 Single Leg Stance - Left Leg: 10.52 Tandem Stance - Right Leg: 30 Tandem Stance - Left Leg: 30            Cognition Arousal/Alertness: Awake/alert Behavior During Therapy: WFL for tasks assessed/performed Overall Cognitive Status: Within Functional Limits for tasks assessed                      Exercises      General Comments General comments (skin integrity, edema, etc.): ambulate in hall with head turns, 180 degree turns-able to self recover      Pertinent Vitals/Pain Pain Assessment: No/denies pain    Home Living                      Prior Function            PT Goals (current goals can now be found in the care plan section) Acute Rehab PT Goals Patient Stated Goal: I want to drive home to [place] today if possible PT Goal Formulation: With patient/family Time For Goal Achievement: 03/06/16 Potential to Achieve Goals: Good  Progress towards PT goals: Progressing toward goals    Frequency    Min 3X/week      PT Plan Current plan remains appropriate    Co-evaluation             End of Session   Activity Tolerance: Patient tolerated treatment well Patient left: in chair;with family/visitor present     Time: [id number] PT Time Calculation (min) (ACUTE ONLY): 27 min  Charges:  $Gait Training: 8-22 mins $Neuromuscular Re-education: 8-22 mins                    G CodesNestor Lewandowsky:      [name]  [name], PT [phone]  [name] 02/29/2016, 10:36 AM

## 2016-02-29 NOTE — Discharge Instructions (Signed)
° °  [place]  Get CBC, CMP,  checked  by Primary MD next visit.    Activity: As tolerated with Full fall precautions use walker/cane & assistance as needed   Disposition Home    Diet: Heart Healthy  .   On your next visit with your primary care physician please Get Medicines reviewed and adjusted.   Please request your Prim.MD to go over all Hospital Tests and Procedure/Radiological results at the follow up, please get all Hospital records sent to your Prim MD by signing hospital release before you go home.   If you experience worsening of your admission symptoms, develop shortness of breath, life threatening emergency, suicidal or homicidal thoughts you must seek medical attention immediately by calling 911 or calling your MD immediately  if symptoms less severe.  You Must read complete instructions/literature along with all the possible adverse reactions/side effects for all the Medicines you take and that have been prescribed to you. Take any new Medicines after you have completely understood and accpet all the possible adverse reactions/side effects.   Do not drive, operating heavy machinery, perform activities at heights, swimming or participation in water activities or provide baby sitting services if your were admitted for syncope or siezures until you have seen by Primary MD or a Neurologist and advised to do so again.  Do not drive when taking Pain medications.    Do not take more than prescribed Pain, Sleep and Anxiety Medications  Special Instructions: If you have smoked or chewed Tobacco  in the last 2 yrs please stop smoking, stop any regular Alcohol  and or any Recreational drug use.  Wear Seat belts while driving.   Please note  You were cared for by a hospitalist during your hospital stay. If you have any questions about your discharge medications or the care you received while you were in the hospital after you are discharged, you  can call the unit and asked to speak with the hospitalist on call if the hospitalist that took care of you is not available. Once you are discharged, your primary care physician will handle any further medical issues. Please note that NO REFILLS for any discharge medications will be authorized once you are discharged, as it is imperative that you return to your primary care physician (or establish a relationship with a primary care physician if you do not have one) for your aftercare needs so that they can reassess your need for medications and monitor your lab values.

## 2016-02-29 NOTE — Discharge Summary (Signed)
Jacob Lynch, is a 60 y.o. male  DOB 07-20-1955  MRN [id number].  Admission date:  02/27/2016  Admitting Physician  Ozella Rocks, MD  Discharge Date:  02/29/2016   Primary MD  No PCP Per Patient  Recommendations for primary care physician for things to follow:  -  need cardiology referral as an outpatient for TEE, and if negative, loop recorder recommended to assess for atrial fibrillation as possible source of stroke. - Will need cardiology referral for discussion of PFO closure. - Please check CBC, BMP during next visit.   Admission Diagnosis  Cerebrovascular accident (CVA), unspecified mechanism (HCC) [I63.9]   Discharge Diagnosis  Cerebrovascular accident (CVA), unspecified mechanism (HCC) [I63.9]    Principal Problem:   Cerebellar stroke (HCC) Active Problems:   Nausea with vomiting   HTN (hypertension)   Acute hyperglycemia   Paroxysmal SVT (supraventricular tachycardia) (HCC)      Past Medical History:  Diagnosis Date  . Hypertension     History reviewed. No pertinent surgical history.     History of present illness and  Hospital Course:     Kindly see H&P for history of present illness and admission details, please review complete Labs, Consult reports and Test reports for all details in brief  HPI  from the history and physical done on the day of admission On 02/26/2014   Admission status: Inpatient/telemetry -medically necessary to stay a minimum 2 midnights to rule out impending and/or unexpected changes in physiologic status that may differ from initial evaluation performed in the ER and/or at time of admission. 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 Patient will require IV fluids and IV medications  to manage his dizziness and nausea symptoms until he is able to tolerate oral medications/diet. He will also undergo an extensive stroke evaluation including PT/OT evaluation with likelihood he may discharge to a rehabilitation facility for further stroke care  Chief Complaint: Dizziness with nausea and vomiting  HPI: Jacob Lynch is a 60 y.o. male with medical history significant for hypertension and SVT. Patient works as a Research scientist (medical) and had traveled from [place] to [place] to conduct business. He did fly via [employer] but was not on the plane longer than 2 hours. He has not taken any international flights recently. He reports that around 10:00 last night he became extremely dizzy with significant nausea and repeated episodes of vomiting. He did not recall any vertigo symptoms. He did not recall any specific visual field changes, blurred vision or diplopia. He was driven by a worker from [place] to [place] and brought to the ER here for further evaluation and treatment. Patient does report that his father has a history of CVA.   Hospital Course   Jacob Knechtlyis an 60 y.o.malewith a history of hypertension who experienced acute onset of vertigo nausea and vomiting and unstable gait at 10 PM on 02/26/2016 (LKW).  There was no focal weakness. He had no speech change and no diplopia. There is no previous history of stroke nor TIA. He has not been on antiplatelet therapy. CT scan and MRI showed findings consistent with acute right nonhemorrhagic PICA territory infarction. MR angiogram was unremarkable with no large vessel occlusion or significant stenosis. He has continued to experience vertigo and nausea, although improved from last night. Patient was not administered IV t-PA secondary to Beyond time window for treatment consideration. He was admitted for further evaluation and treatment.  Acute CVA -   Right PICA infarct with cytotoxic cerebral edema with mild mass effect 4th  ventricle and petechial hemorrhage, infarct felt to be embolic due to unknown source - Resultant  Nausea and vomiting, dizziness - MRI  Moderate R PICA infarct with cerebellar edema and petechial hemorrhagic transformation. Mild mass effect 4th ventricle. 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 - Patient lives at [place], [place], he wishes to be discharged home today, and to continue his workup closer to his home, and reports he be able to arrange follow-up cardiology through his PCP, patient will need TEE to look for source of a stroke, and if negative he will need loop recorder to assess for atrial fibrillation as possible source of stroke, as well giving trans-cranial bubble study finding of small PFO, will need to follow with cardiology regarding recommendation oral of PFO closure.  Hypertension - Allow for permissive hypertension during hospital stay, will resume her home medication on discharge.  Hyperlipidemia - LDL is 108, started on Lipitor   Discharge Condition:  Stable   Follow UP With PCP this week to arrange for cardiology follow-up.     Discharge Instructions  and  Discharge Medications     Discharge Instructions    Diet - low sodium heart healthy    Complete by:  As directed    Discharge instructions    Complete by:  As directed    Follow with Primary MD in [place] ,[place]  Get CBC, CMP,  checked  by Primary MD next visit.    Activity: As tolerated with Full fall precautions use walker/cane & assistance as needed   Disposition Home    Diet: Heart Healthy  .   On your next visit with your primary care physician please Get  Medicines reviewed and adjusted.   Please request your Prim.MD to go over all Hospital Tests and Procedure/Radiological results at the follow up, please get all Hospital records sent to your Prim MD by signing hospital release before you go home.   If you experience worsening of your admission symptoms, develop shortness of breath, life threatening emergency, suicidal or homicidal thoughts you must seek medical attention immediately by calling 911 or calling your MD immediately  if symptoms less severe.  You Must read complete instructions/literature along with all the possible adverse reactions/side effects for all the Medicines you take and that have been prescribed to you. Take any new Medicines after you have completely understood and accpet all the possible adverse reactions/side effects.   Do not drive, operating heavy machinery, perform activities at heights, swimming  or participation in water activities or provide baby sitting services if your were admitted for syncope or siezures until you have seen by Primary MD or a Neurologist and advised to do so again.  Do not drive when taking Pain medications.    Do not take more than prescribed Pain, Sleep and Anxiety Medications  Special Instructions: If you have smoked or chewed Tobacco  in the last 2 yrs please stop smoking, stop any regular Alcohol  and or any Recreational drug use.  Wear Seat belts while driving.   Please note  You were cared for by a hospitalist during your hospital stay. If you have any questions about your discharge medications or the care you received while you were in the hospital after you are discharged, you can call the unit and asked to speak with the hospitalist on call if the hospitalist that took care of you is not available. Once you are discharged, your primary care physician will handle any further medical issues. Please note that NO REFILLS for any discharge medications will be authorized once you are  discharged, as it is imperative that you return to your primary care physician (or establish a relationship with a primary care physician if you do not have one) for your aftercare needs so that they can reassess your need for medications and monitor your lab values.   Increase activity slowly    Complete by:  As directed        Medication List    TAKE these medications   aspirin 325 MG tablet Take 1 tablet (325 mg total) by mouth daily. Start taking on:  03/01/2016   atorvastatin 40 MG tablet Commonly known as:  LIPITOR Take 1 tablet (40 mg total) by mouth daily at 6 PM.   lisinopril-hydrochlorothiazide 20-12.5 MG tablet Commonly known as:  PRINZIDE,ZESTORETIC Take 1 tablet by mouth daily.         Diet and Activity recommendation: See Discharge Instructions above   Consults obtained -  Neurology   Major procedures and Radiology Reports - PLEASE review detailed and final reports for all details, in brief -   2-D echo: Study Conclusions  - Left ventricle: The cavity size was normal. Wall thickness was   increased in a pattern of mild LVH. Systolic function was normal.   The estimated ejection fraction was in the range of 60% to 65%.   Left ventricular diastolic function parameters were normal. - Aorta: Shadowing artifact in arch on supra sternal notch images. - Left atrium: The atrium was mildly dilated. - Atrial septum: No defect or patent foramen ovale was identified.  ------------------------------------------------------------------- Study data:  No prior study was available for comparison.  Study status:  Routine.  Procedure:  The patient reported no pain pre or post test. Transthoracic echocardiography. Image quality was adequate.  Study completion:  There were no complications. Echocardiography.  M-mode, complete 2D, spectral Doppler, and color Doppler.  Birthdate:  Patient birthdate: 02-27-1956.  Age:  Patient is 60 yr old.  Sex:  Gender: male.    BMI:  24.8 kg/m^2.  Blood pressure:     148/67  Patient status:  Inpatient.  Study date: Study date: 02/28/2016. Study time: 11:42 AM.  Location:  Echo laboratory.  -------------------------------------------------------------------  ------------------------------------------------------------------- Left ventricle:  The cavity size was normal. Wall thickness was increased in a pattern of mild LVH. Systolic function was normal. The estimated ejection fraction was in the range of 60% to 65%. The transmitral flow pattern was normal. The  deceleration time of the early transmitral flow velocity was normal. The pulmonary vein flow pattern was normal. The tissue Doppler parameters were normal. Left ventricular diastolic function parameters were normal.  ------------------------------------------------------------------- Aortic valve:   Structurally normal valve.   Cusp separation was normal.  Doppler:  Transvalvular velocity was within the normal range. There was no stenosis. There was no regurgitation.  ------------------------------------------------------------------- Aorta:  Shadowing artifact in arch on supra sternal notch images. Ascending aorta: The ascending aorta was mildly dilated.  ------------------------------------------------------------------- Mitral valve:   Mildly thickened leaflets .  Doppler:  There was trivial regurgitation.  ------------------------------------------------------------------- Left atrium:  The atrium was mildly dilated.  ------------------------------------------------------------------- Atrial septum:  No defect or patent foramen ovale was identified.   ------------------------------------------------------------------- Right ventricle:  The cavity size was normal. Wall thickness was normal. Systolic function was normal.  ------------------------------------------------------------------- Pulmonic valve:    Doppler:  There was trivial  regurgitation.  ------------------------------------------------------------------- Tricuspid valve:   Doppler:  There was trivial regurgitation.   ------------------------------------------------------------------- Right atrium:  The atrium was normal in size.  ------------------------------------------------------------------- Pericardium:  The pericardium was normal in appearance.  ------------------------------------------------------------------- Systemic veins: Inferior vena cava: The vessel was normal in size. The respirophasic diameter changes were in the normal range (= 50%), consistent with normal central venous pressure.  ------------------------------------------------------------------- Post procedure conclusions Ascending Aorta:  - Shadowing artifact in arch on supra sternal notch images.   Transcranial Bubble study   Performed by Dr. Pearlean Brownie. Verbal consent was taken and risks/ benefits explained. Right forearm IV site was used. Left middle cerebral artery was insonated.  + HITS heard at rest. ++ HITS heard during Valsalva.  Positive for Small PFO.    Ct Head Wo Contrast  Result Date: 02/27/2016 CLINICAL DATA:  60 year old male with vertigo, nausea and vomiting. EXAM: CT HEAD WITHOUT CONTRAST TECHNIQUE: Contiguous axial images were obtained from the base of the skull through the vertex without intravenous contrast. COMPARISON:  None. FINDINGS: Brain: There is a region of relative hypoattenuation in the right cerebellar hemisphere extending to the posteromedial cortex and the vermis. No evidence of hemorrhagic transformation. There is mild blurring of the adjacent gray-white interface. No definite extension to the medulla by CT imaging. Very mild cerebral cortical volume loss. No significant chronic microvascular ischemic white matter changes. No mass lesion, mass effect or hydrocephalus. Vascular: No hyperdense vessel or unexpected calcification. Skull:  Normal. Negative for fracture or focal lesion. Sinuses/Orbits: No acute finding. Other: None. IMPRESSION: Positive for findings of a subacute infarct in the right inferior cerebellar hemisphere in the region of the posterior inferior cerebellar artery (PICA). Cerebral cortical volume loss. These results were called by telephone at the time of interpretation on 02/27/2016 at 11:50 am to Dr. Rolland Porter , who verbally acknowledged these results. Electronically Signed   By: Malachy Moan M.D.   On: 02/27/2016 11:50   Mr Shirlee Latch WU Contrast  Result Date: 02/27/2016 CLINICAL DATA:  60 year old male with acute onset dizziness nausea and vomiting 2200 hours yesterday. Evidence of cerebellar infarct on noncontrast head CT today. Initial encounter. EXAM: MRI HEAD WITHOUT CONTRAST MRA HEAD WITHOUT CONTRAST TECHNIQUE: Multiplanar, multiecho pulse sequences of the brain and surrounding structures were obtained without intravenous contrast. Angiographic images of the head were obtained using MRA technique without contrast. COMPARISON:  Head CT 1140 hours. FINDINGS: MRI HEAD FINDINGS Brain: Moderate size (5.5 x 4 cm) area of confluent restricted diffusion in the inferior right cerebellum, extending from the lateral and posterior cerebellar tonsil to the  right cerebellar vermis. Cerebellar edema with mild petechial hemorrhage (series 9, image 4) and mild mass effect on the fourth ventricle. Basilar cisterns remain patent. No ventriculomegaly or transependymal edema. No other restricted diffusion. However, there is a small chronic posterior left cerebellar infarct (series 7, image 5). Negative brainstem. Elsewhere there is minimal nonspecific cerebral white matter T2 and FLAIR hyperintensity. No supratentorial cortical encephalomalacia or chronic cerebral blood products. No midline shift, evidence of mass lesion, extra-axial collection or acute intracranial hemorrhage. Cervicomedullary junction and pituitary are within  normal limits. Vascular: Major intracranial vascular flow voids are preserved, the distal left vertebral artery appears dominant and is somewhat dolichoectatic. There is also a degree of anterior circulation dolichoectasia. Skull and upper cervical spine: Negative. Normal bone marrow signal. Sinuses/Orbits: Normal orbit soft tissues. Visualized paranasal sinuses and mastoids are stable and well pneumatized. Other: Visible internal auditory structures appear normal. Negative scalp soft tissues. MRA HEAD FINDINGS Symmetric appearing antegrade flow in both distal cervical vertebral artery is, the left is dominant. There does appear to be preserved flow signal at the right PICA origin. Decreased signal in the right V4 segment distal to the PICA might be artifact due to its horizontal course. The vertebrobasilar junction is patent. The basilar artery is patent without stenosis. Normal SCA and left PCA origins. Fetal type right PCA origin. The left posterior communicating artery is present. Bilateral PCA branches are normal. Antegrade flow in both ICA siphons. Tortuous siphons with no stenosis. Normal ophthalmic and posterior communicating artery origins. Patent carotid termini, MCA and ACA origins. Anterior communicating artery and visualized ACA branches are within normal limits (there is a median artery of the corpus callosum, normal variant). Tortuous but otherwise normal MCA M1 segments. Patent MCA bifurcations. Visualized bilateral MCA branches are within normal limits. IMPRESSION: 1. Confluent moderate-sized right PICA territory infarct with cerebellar edema and petechial hemorrhage, but no malignant hemorrhagic transformation. 2. There is mild mass effect on the fourth ventricle, but no ventriculomegaly and other basilar cisterns are patent. 3. Patent non dominant distal right vertebral artery. The right PICA origin also appears to be patent. 4. No other acute infarct, but there is a small chronic left cerebellar  infarct. 5. Generalized intracranial artery dolichoectasia. Electronically Signed   By: Odessa FlemingH  Hall M.D.   On: 02/27/2016 19:36   Mr Brain Wo Contrast  Result Date: 02/27/2016 CLINICAL DATA:  60 year old male with acute onset dizziness nausea and vomiting 2200 hours yesterday. Evidence of cerebellar infarct on noncontrast head CT today. Initial encounter. EXAM: MRI HEAD WITHOUT CONTRAST MRA HEAD WITHOUT CONTRAST TECHNIQUE: Multiplanar, multiecho pulse sequences of the brain and surrounding structures were obtained without intravenous contrast. Angiographic images of the head were obtained using MRA technique without contrast. COMPARISON:  Head CT 1140 hours. FINDINGS: MRI HEAD FINDINGS Brain: Moderate size (5.5 x 4 cm) area of confluent restricted diffusion in the inferior right cerebellum, extending from the lateral and posterior cerebellar tonsil to the right cerebellar vermis. Cerebellar edema with mild petechial hemorrhage (series 9, image 4) and mild mass effect on the fourth ventricle. Basilar cisterns remain patent. No ventriculomegaly or transependymal edema. No other restricted diffusion. However, there is a small chronic posterior left cerebellar infarct (series 7, image 5). Negative brainstem. Elsewhere there is minimal nonspecific cerebral white matter T2 and FLAIR hyperintensity. No supratentorial cortical encephalomalacia or chronic cerebral blood products. No midline shift, evidence of mass lesion, extra-axial collection or acute intracranial hemorrhage. Cervicomedullary junction and pituitary are within normal limits. Vascular: Major  intracranial vascular flow voids are preserved, the distal left vertebral artery appears dominant and is somewhat dolichoectatic. There is also a degree of anterior circulation dolichoectasia. Skull and upper cervical spine: Negative. Normal bone marrow signal. Sinuses/Orbits: Normal orbit soft tissues. Visualized paranasal sinuses and mastoids are stable and well  pneumatized. Other: Visible internal auditory structures appear normal. Negative scalp soft tissues. MRA HEAD FINDINGS Symmetric appearing antegrade flow in both distal cervical vertebral artery is, the left is dominant. There does appear to be preserved flow signal at the right PICA origin. Decreased signal in the right V4 segment distal to the PICA might be artifact due to its horizontal course. The vertebrobasilar junction is patent. The basilar artery is patent without stenosis. Normal SCA and left PCA origins. Fetal type right PCA origin. The left posterior communicating artery is present. Bilateral PCA branches are normal. Antegrade flow in both ICA siphons. Tortuous siphons with no stenosis. Normal ophthalmic and posterior communicating artery origins. Patent carotid termini, MCA and ACA origins. Anterior communicating artery and visualized ACA branches are within normal limits (there is a median artery of the corpus callosum, normal variant). Tortuous but otherwise normal MCA M1 segments. Patent MCA bifurcations. Visualized bilateral MCA branches are within normal limits. IMPRESSION: 1. Confluent moderate-sized right PICA territory infarct with cerebellar edema and petechial hemorrhage, but no malignant hemorrhagic transformation. 2. There is mild mass effect on the fourth ventricle, but no ventriculomegaly and other basilar cisterns are patent. 3. Patent non dominant distal right vertebral artery. The right PICA origin also appears to be patent. 4. No other acute infarct, but there is a small chronic left cerebellar infarct. 5. Generalized intracranial artery dolichoectasia. Electronically Signed   By: Odessa Fleming M.D.   On: 02/27/2016 19:36    Micro Results     No results found for this or any previous visit (from the past 240 hour(s)).     Today   Subjective:   Demetrion Pustejovsky today has no headache,no chest or abdominal pain,no new weakness tingling or numbness, feels much better wants to go home  today.   Objective:   Blood pressure (!) 143/78, pulse (!) 55, temperature 97.9 F (36.6 C), temperature source Oral, resp. rate 18, height 6\' 7"  (2.007 m), weight 99.8 kg (220 lb), SpO2 97 %.   Intake/Output Summary (Last 24 hours) at 02/29/16 1121 Last data filed at 02/28/16 1655  Gross per 24 hour  Intake           643.33 ml  Output                0 ml  Net           643.33 ml    Exam Awake Alert, Oriented x 3, No new F.N deficits, Normal affect [place].AT,PERRAL Supple Neck,No JVD, Symmetrical Chest wall movement, Good air movement bilaterally, CTAB RRR,No Gallops,Rubs or new Murmurs, No Parasternal Heave +ve B.Sounds, Abd Soft, Non tender, , No rebound -guarding or rigidity. No Cyanosis, Clubbing or edema, No new Rash or bruise  Data Review   CBC w Diff: Lab Results  Component Value Date   WBC 7.4 02/29/2016   HGB 12.3 (L) 02/29/2016   HCT 37.6 (L) 02/29/2016   PLT 158 02/29/2016   LYMPHOPCT 8 02/27/2016   MONOPCT 6 02/27/2016   EOSPCT 0 02/27/2016   BASOPCT 0 02/27/2016    CMP: Lab Results  Component Value Date   NA 139 02/29/2016   K 3.5 02/29/2016   CL 106  02/29/2016   CO2 25 02/29/2016   BUN 22 (H) 02/29/2016   CREATININE 0.89 02/29/2016  .   Total Time in preparing paper work, data evaluation and todays exam - 35 minutes  Oluwasemilore Bahl M.D on 02/29/2016 at 11:21 AM  [facility]   Office  [phone]

## 2016-02-29 NOTE — Progress Notes (Signed)
Patient ready for discharge to home; discharge instructions given and reviewed; Rx's given; patient discharged out via wheelchair accompanied by his daughter.

## 2016-03-01 LAB — ANTIPHOSPHOLIPID SYNDROME EVAL, BLD
Anticardiolipin IgG: <9 GPL U/mL (ref 0–14)
DRVVT: 33.9 s (ref 0.0–47.0)
PHOSPHATYDALSERINE, IGM: 7 {MPS'U} (ref 0–25)
PTT Lupus Anticoagulant: 32.5 s (ref 0.0–51.9)
Phosphatydalserine, IgA: 1 APS IgA (ref 0–20)
Phosphatydalserine, IgG: 2 GPS IgG (ref 0–11)

## 2016-03-03 ENCOUNTER — Telehealth: Payer: Self-pay | Admitting: Neurology

## 2016-03-03 NOTE — Telephone Encounter (Signed)
Pt's wife called, pt was seen in the hospital was told to call if they had questions.  Wife would like to be called by Dr. Pearlean BrownieSethi - [phone] Topics:Indigestion, headache, exercising  , taste/smell

## 2016-03-03 NOTE — Telephone Encounter (Signed)
Message sent to Dr.Sethi. [place] in [place], and will see his MD there for work up. This is per the discharge note. Pt has no appts at [facility].

## 2016-03-03 NOTE — Telephone Encounter (Signed)
I spoke to the patient's wife, and addressed her questions about patient having some headache, taste difficulties. He was advised to see his primary care physician and has an appointment tomorrow. He was also advised to seek referral to see a neurologist as well as had outpatient TEE and heart monitor done. She voiced understanding.

## 2017-08-09 IMAGING — MR MR MRA HEAD W/O CM
9 of 11 series · 30 of 48 positions shown · non-contrast
Comparison: Head CT 6643 hours.

CLINICAL DATA: 60-year-old male with acute onset dizziness nausea
and vomiting 7788 hours yesterday. Evidence of cerebellar infarct on
noncontrast head CT today. Initial encounter.

EXAM:
MRI HEAD WITHOUT CONTRAST
MRA HEAD WITHOUT CONTRAST
TECHNIQUE: Multiplanar, multiecho pulse sequences of the brain and surrounding
structures were obtained without intravenous contrast. Angiographic
images of the head were obtained using MRA technique without
contrast.

[Series 3: DWI · axial · 3.0mm · 1.09mm/px · z∈[-47,+96]mm · 6 of 98 slices shown (1 of 4)]
[im 1/98]
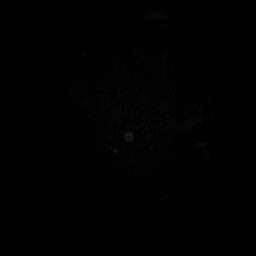
[im 20/98]
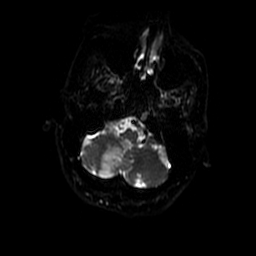
[im 39/98]
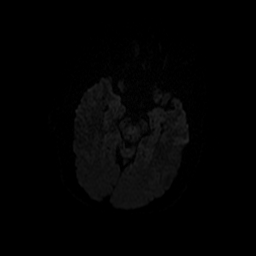
[im 59/98]
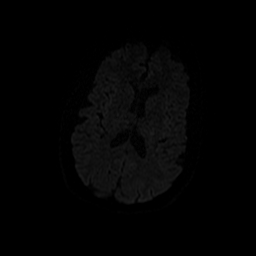
[im 78/98]
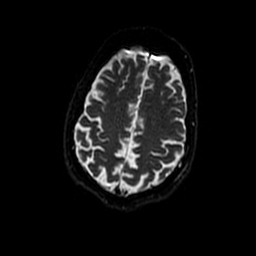
[im 98/98]
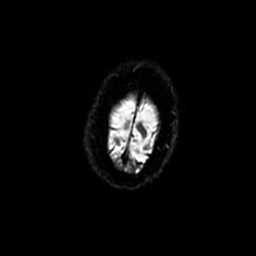

[Series 4: (id) mt fs · axial · 1.4mm · 0.43mm/px · z∈[-62,-14]mm · 4 of 152 slices shown]
[im 1/152]
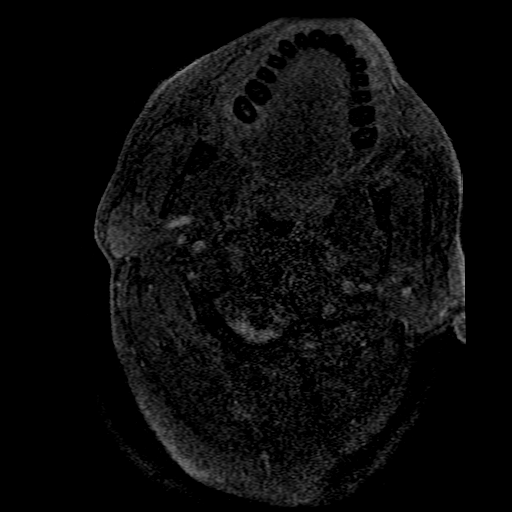
[im 28/152]
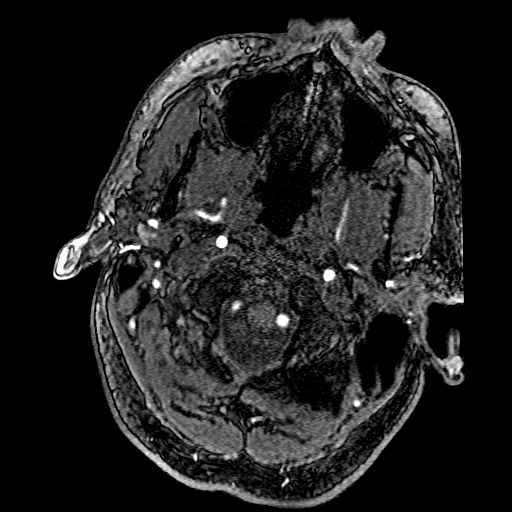
[im 42/152]
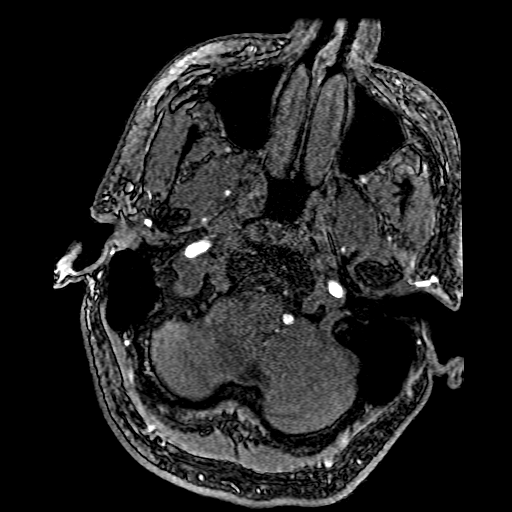
[im 69/152]
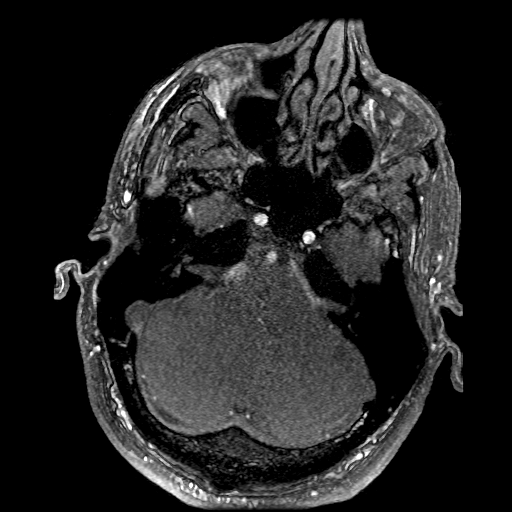

[Series 5: T1 · sagittal · 5.0mm · 0.47mm/px · 2 of 23 slices shown]
[im 1/23]
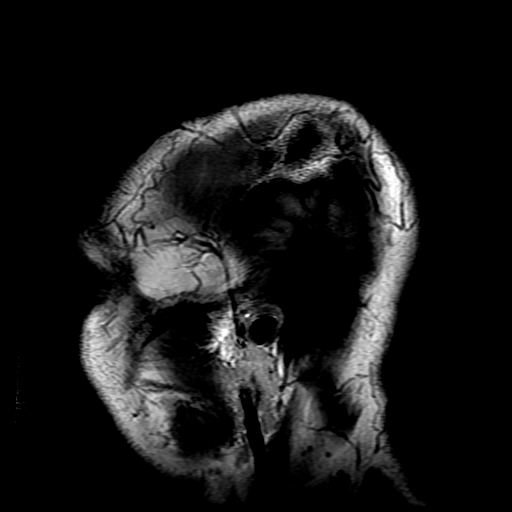
[im 23/23]
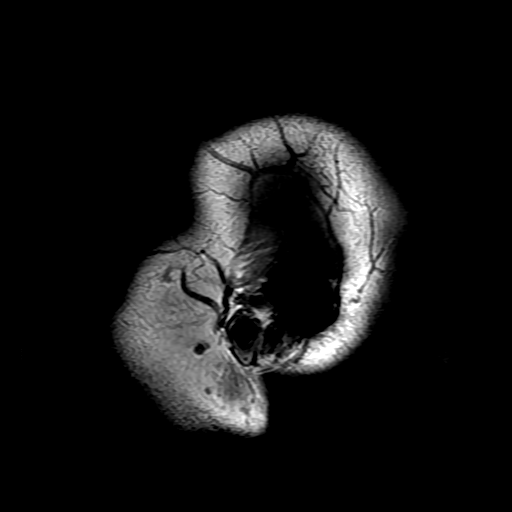

[Series 6: DWI · coronal · 5.0mm · 1.09mm/px · 5 of 72 slices shown (2 of 4)]
[im 1/72]
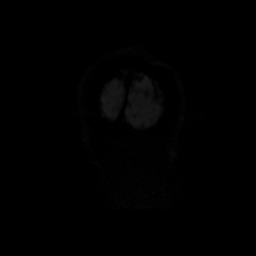
[im 18/72]
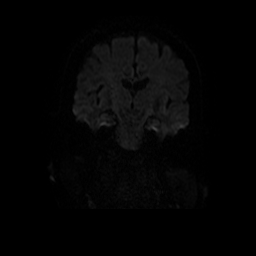
[im 36/72]
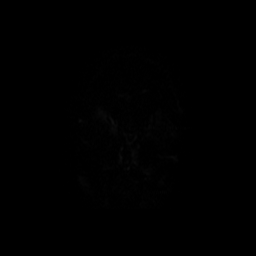
[im 54/72]
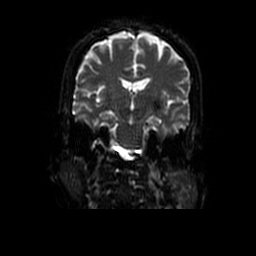
[im 72/72]
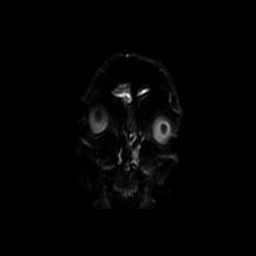

[Series 7: T2 · axial · 5.0mm · 0.43mm/px · z∈[-47,+97]mm · 2 of 25 slices shown]
[im 1/25]
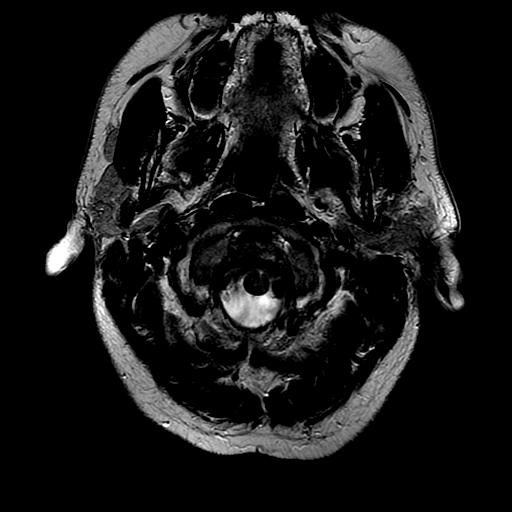
[im 25/25]
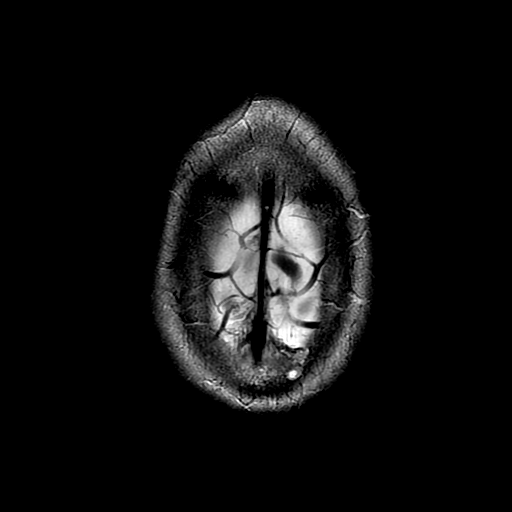

[Series 8: FLAIR · axial · 5.0mm · 0.43mm/px · z∈[-48,+95]mm · 2 of 25 slices shown]
[im 1/25]
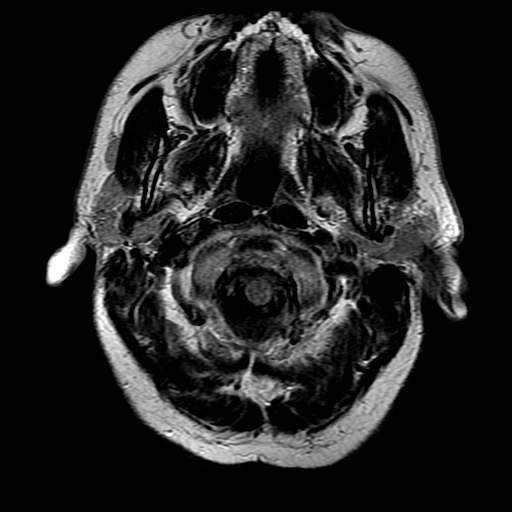
[im 25/25]
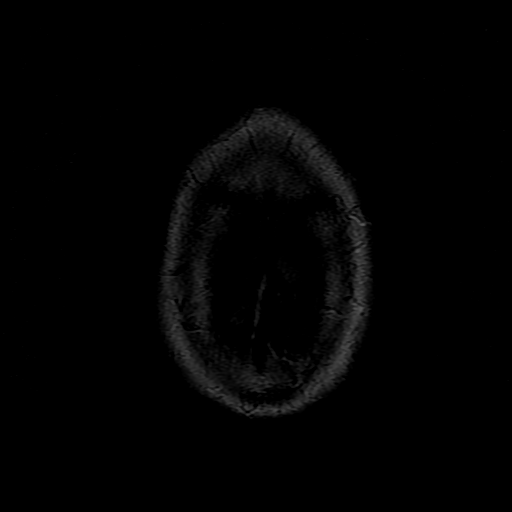

[Series 11: T2 post-contrast · coronal · 5.0mm · 0.43mm/px · 2 of 28 slices shown]
[im 1/28]
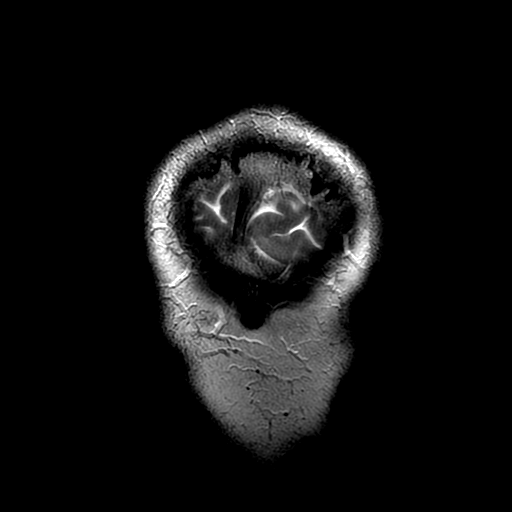
[im 28/28]
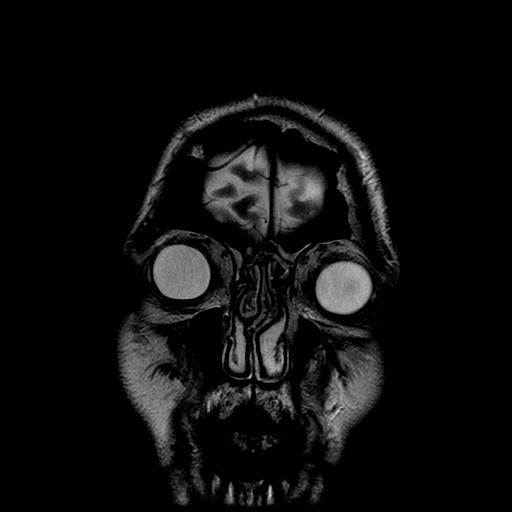

[Series 300: DWI · axial · 3.0mm · 1.09mm/px · z∈[-47,+96]mm · 4 of 49 slices shown (3 of 4)]
[im 1/49]
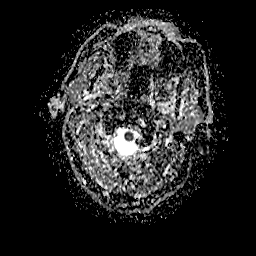
[im 17/49]
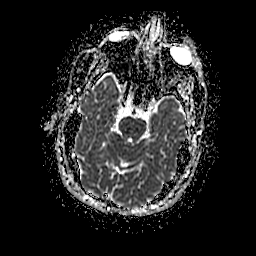
[im 33/49]
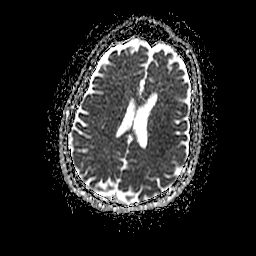
[im 49/49]
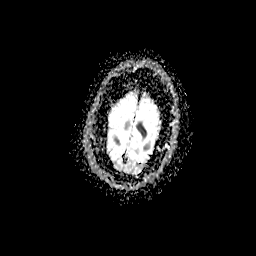

[Series 600: DWI · coronal · 5.0mm · 1.09mm/px · 3 of 36 slices shown (4 of 4)]
[im 1/36]
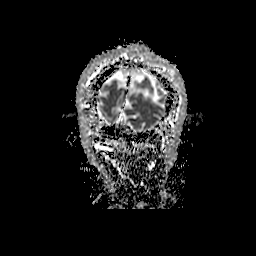
[im 18/36]
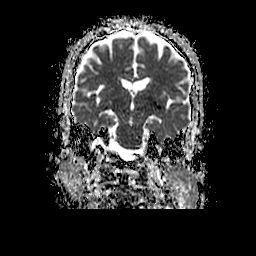
[im 36/36]
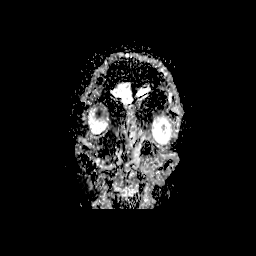

[30 of 48 positions shown; findings below may reference images not displayed]

FINDINGS: MRI HEAD FINDINGS

Brain: Moderate size (5.5 x 4 cm) area of confluent restricted
diffusion in the inferior right cerebellum, extending from the
lateral and posterior cerebellar tonsil to the right cerebellar
vermis. Cerebellar edema with mild petechial hemorrhage (series 9,
image 4) and mild mass effect on the fourth ventricle. Basilar
cisterns remain patent.

No ventriculomegaly or transependymal edema. No other restricted
diffusion. However, there is a small chronic posterior left
cerebellar infarct (series 7, image 5). Negative brainstem.

Elsewhere there is minimal nonspecific cerebral white matter T2 and
FLAIR hyperintensity. No supratentorial cortical encephalomalacia or
chronic cerebral blood products. No midline shift, evidence of mass
lesion, extra-axial collection or acute intracranial hemorrhage.
Cervicomedullary junction and pituitary are within normal limits.

Vascular: Major intracranial vascular flow voids are preserved, the
distal left vertebral artery appears dominant and is somewhat
dolichoectatic. There is also a degree of anterior circulation
dolichoectasia.

Skull and upper cervical spine: Negative. Normal bone marrow signal.

Sinuses/Orbits: Normal orbit soft tissues. Visualized paranasal
sinuses and mastoids are stable and well pneumatized.

Other: Visible internal auditory structures appear normal. Negative
scalp soft tissues.

MRA HEAD FINDINGS

Symmetric appearing antegrade flow in both distal cervical vertebral
artery is, the left is dominant. There does appear to be preserved
flow signal at the right PICA origin. Decreased signal in the right
V4 segment distal to the PICA might be artifact due to its
horizontal course. The vertebrobasilar junction is patent. The
basilar artery is patent without stenosis. Normal SCA and left PCA
origins. Fetal type right PCA origin. The left posterior
communicating artery is present. Bilateral PCA branches are normal.

Antegrade flow in both ICA siphons. Tortuous siphons with no
stenosis. Normal ophthalmic and posterior communicating artery
origins. Patent carotid termini, MCA and ACA origins. Anterior
communicating artery and visualized ACA branches are within normal
limits (there is a median artery of the corpus callosum, normal
variant). Tortuous but otherwise normal MCA M1 segments. Patent MCA
bifurcations. Visualized bilateral MCA branches are within normal
limits.
IMPRESSION: 1. Confluent moderate-sized right PICA territory infarct with
cerebellar edema and petechial hemorrhage, but no malignant
hemorrhagic transformation.
2. There is mild mass effect on the fourth ventricle, but no
ventriculomegaly and other basilar cisterns are patent.
3. Patent non dominant distal right vertebral artery. The right PICA
origin also appears to be patent.
4. No other acute infarct, but there is a small chronic left
cerebellar infarct.
5. Generalized intracranial artery dolichoectasia.
